# Patient Record
Sex: Male | Born: 2000 | Race: Black or African American | Hispanic: No | Marital: Single | Smoking: Current some day smoker
Health system: Southern US, Community
[De-identification: ages and names within clinical notes are randomized; demographics above are authoritative.]

## PROBLEM LIST (undated history)

## (undated) DIAGNOSIS — S6990XA Unspecified injury of unspecified wrist, hand and finger(s), initial encounter: Secondary | ICD-10-CM

---
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
## 2021-04-03 ENCOUNTER — Encounter (HOSPITAL_COMMUNITY): Payer: Self-pay

## 2021-04-03 ENCOUNTER — Other Ambulatory Visit: Payer: Self-pay

## 2021-04-03 ENCOUNTER — Ambulatory Visit (HOSPITAL_COMMUNITY)
Admission: RE | Admit: 2021-04-03 | Discharge: 2021-04-03 | Disposition: A | Discharge status: Home / Self Care | Payer: Medicaid Other | Source: Ambulatory Visit | Attending: Urgent Care | Admitting: Urgent Care

## 2021-04-03 VITALS — BP 146/68 | HR 76 | Temp 98.5°F | Resp 18

## 2021-04-03 DIAGNOSIS — R300 Dysuria: Secondary | ICD-10-CM | POA: Diagnosis present

## 2021-04-03 DIAGNOSIS — Z113 Encounter for screening for infections with a predominantly sexual mode of transmission: Secondary | ICD-10-CM | POA: Insufficient documentation

## 2021-04-03 LAB — HIV ANTIBODY (ROUTINE TESTING W REFLEX): HIV Screen 4th Generation wRfx: NONREACTIVE

## 2021-04-03 NOTE — ED Provider Notes (Signed)
  [facility]   MRN: [id number] DOB: 2001/11/21  Subjective:   Joseph Buchanan is a 20 y.o. male presenting for general screening for STDs.  Patient is sexually active but consistently uses condoms for protection.  It has been 1 year since he got checked.  Reports that he has had some mild burning after he urinates. Denies hematuria, urinary frequency, penile discharge, penile swelling, testicular pain, testicular swelling, anal pain, groin pain.  Does not hydrate very well with water.  Drinks sodas daily.  Denies regular use of alcohol, coffee, energy drinks.  No current facility-administered medications for this encounter. No current outpatient medications on file.   No Known Allergies  Past Medical History:  Diagnosis Date  . Hand injury    Broken right hand     History reviewed. No pertinent surgical history.  History reviewed. No pertinent family history.  Social History   Tobacco Use  . Smoking status: Passive Smoke Exposure - Never Smoker  . Smokeless tobacco: Never Used  Substance Use Topics  . Alcohol use: No  . Drug use: No    ROS   Objective:   Vitals: BP (!) 146/68 (BP Location: Left Arm)   Pulse 76   Temp 98.5 F (36.9 C) (Oral)   Resp 18   SpO2 99%   Physical Exam Constitutional:      General: He is not in acute distress.    Appearance: Normal appearance. He is well-developed and normal weight. He is not ill-appearing, toxic-appearing or diaphoretic.  HENT:     Head: Normocephalic and atraumatic.     Right Ear: External ear normal.     Left Ear: External ear normal.     Nose: Nose normal.     Mouth/Throat:     Pharynx: Oropharynx is clear.  Eyes:     General: No scleral icterus.       Right eye: No discharge.        Left eye: No discharge.     Extraocular Movements: Extraocular movements intact.     Pupils: Pupils are equal, round, and reactive to light.  Cardiovascular:     Rate and Rhythm: Normal rate.  Pulmonary:      Effort: Pulmonary effort is normal.  Musculoskeletal:     Cervical back: Normal range of motion.  Neurological:     Mental Status: He is alert and oriented to person, place, and time.  Psychiatric:        Mood and Affect: Mood normal.        Behavior: Behavior normal.        Thought Content: Thought content normal.        Judgment: Judgment normal.     Assessment and Plan :   PDMP not reviewed this encounter.  1. Screen for STD (sexually transmitted disease)   2. Dysuria     STI testing pending.  Recommended patient hydrate better with plain water and cut back on urinary irritants such as soda. Counseled patient on potential for adverse effects with medications prescribed/recommended today, ER and return-to-clinic precautions discussed, patient verbalized understanding.    Wallis Bamberg, PA-C 04/03/21 1324

## 2021-04-03 NOTE — ED Triage Notes (Signed)
PT reports no SX's .

## 2021-04-04 LAB — CYTOLOGY, (ORAL, ANAL, URETHRAL) ANCILLARY ONLY
Chlamydia: NEGATIVE
Comment: NEGATIVE
Comment: NEGATIVE
Comment: NORMAL
Neisseria Gonorrhea: NEGATIVE
Trichomonas: NEGATIVE

## 2021-04-04 LAB — RPR: RPR Ser Ql: NONREACTIVE

## 2021-07-13 ENCOUNTER — Other Ambulatory Visit: Payer: Self-pay

## 2021-07-13 ENCOUNTER — Ambulatory Visit (HOSPITAL_COMMUNITY)
Admission: EM | Admit: 2021-07-13 | Discharge: 2021-07-13 | Disposition: A | Discharge status: Home / Self Care | Payer: Medicaid Other | Attending: Internal Medicine | Admitting: Internal Medicine

## 2021-07-13 ENCOUNTER — Encounter (HOSPITAL_COMMUNITY): Payer: Self-pay | Admitting: *Deleted

## 2021-07-13 DIAGNOSIS — R21 Rash and other nonspecific skin eruption: Secondary | ICD-10-CM | POA: Diagnosis not present

## 2021-07-13 DIAGNOSIS — R238 Other skin changes: Secondary | ICD-10-CM | POA: Insufficient documentation

## 2021-07-13 DIAGNOSIS — N4889 Other specified disorders of penis: Secondary | ICD-10-CM | POA: Diagnosis present

## 2021-07-13 LAB — HIV ANTIBODY (ROUTINE TESTING W REFLEX): HIV Screen 4th Generation wRfx: NONREACTIVE

## 2021-07-13 MED ORDER — VALACYCLOVIR HCL 1 G PO TABS: 1000.0000 mg | ORAL_TABLET | Freq: Two times a day (BID) | ORAL | 0 refills | Status: AC

## 2021-07-13 MED ORDER — VALACYCLOVIR HCL 1 G PO TABS: 1000.0000 mg | ORAL_TABLET | Freq: Every day | ORAL | 0 refills | Status: DC

## 2021-07-13 NOTE — ED Provider Notes (Signed)
[facility]    CSN: [id number] Arrival date & time: 07/13/21  1312      History   Chief Complaint Chief Complaint  Patient presents with   Rash   SEXUALLY TRANSMITTED DISEASE    HPI Joseph Buchanan is a 20 y.o. male.   Patient presents to the urgent care for further evaluation of a rash that is present to penile shaft. Rash has been present for a few days. Denies history of similar rashes. Denies pain but states that it is itchy at times. Denies any penile discharge. Denies any urinary burning or irritation. Denies any known exposure to STD. Denies any recent unprotected sexual intercourse. Denies any scrotal pain.    Rash  Past Medical History:  Diagnosis Date   Hand injury    Broken right hand    There are no problems to display for this patient.   History reviewed. No pertinent surgical history.     Home Medications    Prior to Admission medications   Medication Sig Start Date End Date Taking? Authorizing Provider  valACYclovir (VALTREX) 1000 MG tablet Take 1 tablet (1,000 mg total) by mouth 2 (two) times daily for 7 days. 07/13/21 07/20/21  Lance Muss, FNP    Family History History reviewed. No pertinent family history.  Social History Social History   Tobacco Use   Smoking status: Passive Smoke Exposure - Never Smoker   Smokeless tobacco: Never  Substance Use Topics   Alcohol use: No   Drug use: No     Allergies   Patient has no known allergies.   Review of Systems Review of Systems  Skin:  Positive for rash.  Per HPI  Physical Exam Triage Vital Signs ED Triage Vitals [07/13/21 1440]  Enc Vitals Group     BP (!) 145/92     Pulse Rate 71     Resp 16     Temp 98.3 F (36.8 C)     Temp Source Oral     SpO2 100 %     Weight      Height      Head Circumference      Peak Flow      Pain Score 0     Pain Loc      Pain Edu?      Excl. in GC?    No data found.  Updated Vital Signs BP (!) 145/92   Pulse 71   Temp  98.3 F (36.8 C) (Oral)   Resp 16   SpO2 100%   Visual Acuity Right Eye Distance:   Left Eye Distance:   Bilateral Distance:    Right Eye Near:   Left Eye Near:    Bilateral Near:     Physical Exam Exam conducted with a chaperone present.  Constitutional:      General: He is not in acute distress.    Appearance: Normal appearance.  HENT:     Head: Normocephalic and atraumatic.  Eyes:     Extraocular Movements: Extraocular movements intact.     Conjunctiva/sclera: Conjunctivae normal.  Pulmonary:     Effort: Pulmonary effort is normal.  Genitourinary:    Pubic Area: Rash present.     Penis: No tenderness.      Testes: Normal.     Comments: Vesicular grouping of lesions present to left inferior portion of penile shaft. No surrounding erythema or swelling. No drainage to lesions noted.  Neurological:     General: No focal deficit present.  Mental Status: He is alert and oriented to person, place, and time. Mental status is at baseline.  Psychiatric:        Mood and Affect: Mood normal.        Behavior: Behavior normal.        Thought Content: Thought content normal.        Judgment: Judgment normal.     UC Treatments / Results  Labs (all labs ordered are listed, but only abnormal results are displayed) Labs Reviewed  HSV CULTURE AND TYPING  HIV ANTIBODY (ROUTINE TESTING W REFLEX)  RPR  CYTOLOGY, (ORAL, ANAL, URETHRAL) ANCILLARY ONLY    EKG   Radiology No results found.  Procedures Procedures (including critical care time)  Medications Ordered in UC Medications - No data to display  Initial Impression / Assessment and Plan / UC Course  I have reviewed the triage vital signs and the nursing notes.  Pertinent labs & imaging results that were available during my care of the patient were reviewed by me and considered in my medical decision making (see chart for details).     Rash is highly suspicious for genital herpes. Will treat with valacyclovir x7  days due to suspicion. HSV culture pending. STD penile swab pending for gonorrhea, chlamydia, and trich. HIV and RPR bloodwork pending. Patient was advised to refrain from any sexual activity until results are complete and until lesions are healed. Discussed strict return precautions. Patient verbalized understanding and is agreeable with plan.  Final Clinical Impressions(s) / UC Diagnoses   Final diagnoses:  Rash and nonspecific skin eruption  Vesicular rash  Penile irritation     Discharge Instructions      Please take prescribed medication as directed due to suspicion of genital herpes. STD testing is pending. We will call if these are positive. Please refrain from sexual activity.      ED Prescriptions     Medication Sig Dispense Auth. Provider   valACYclovir (VALTREX) 1000 MG tablet  (Status: Discontinued) Take 1 tablet (1,000 mg total) by mouth daily for 5 days. 5 tablet Lance Muss, FNP   valACYclovir (VALTREX) 1000 MG tablet Take 1 tablet (1,000 mg total) by mouth 2 (two) times daily for 7 days. 14 tablet Lance Muss, FNP      PDMP not reviewed this encounter.   Lance Muss, FNP 07/13/21 1550

## 2021-07-13 NOTE — Discharge Instructions (Addendum)
Please take prescribed medication as directed due to suspicion of genital herpes. STD testing is pending. We will call if these are positive. Please refrain from sexual activity.

## 2021-07-13 NOTE — ED Triage Notes (Signed)
Pt reports a rash on scrotum and std

## 2021-07-14 LAB — CYTOLOGY, (ORAL, ANAL, URETHRAL) ANCILLARY ONLY
Chlamydia: NEGATIVE
Comment: NEGATIVE
Comment: NEGATIVE
Comment: NORMAL
Neisseria Gonorrhea: NEGATIVE
Trichomonas: NEGATIVE

## 2021-07-14 LAB — RPR: RPR Ser Ql: NONREACTIVE

## 2021-07-16 LAB — HSV CULTURE AND TYPING

## 2021-07-17 NOTE — Progress Notes (Signed)
Notified patient of positive result. Counseling completed regarding safe sex practices. Patient stated that valacyclovir that was prescribed at previous visit was not available for pickup at pharmacy. [facility] called and verbal order was completed. Patient notified that prescription was available at [facility]. All questions were answered.

## 2021-07-24 ENCOUNTER — Telehealth (HOSPITAL_COMMUNITY): Payer: Self-pay | Admitting: Emergency Medicine

## 2021-07-24 MED ORDER — VALACYCLOVIR HCL 1 G PO TABS: 1000.0000 mg | ORAL_TABLET | Freq: Two times a day (BID) | ORAL | 0 refills | Status: AC

## 2021-07-24 NOTE — Telephone Encounter (Signed)
Patient returned call to review his results, all questions answered. States he never picked up Valtrex, was sent to wrong pharmacy.  Resent to pharmacy of request.

## 2021-09-03 ENCOUNTER — Ambulatory Visit (HOSPITAL_COMMUNITY)
Admission: EM | Admit: 2021-09-03 | Discharge: 2021-09-03 | Disposition: A | Discharge status: Home / Self Care | Payer: Medicaid Other | Attending: Family Medicine | Admitting: Family Medicine

## 2021-09-03 ENCOUNTER — Encounter (HOSPITAL_COMMUNITY): Payer: Self-pay | Admitting: Emergency Medicine

## 2021-09-03 ENCOUNTER — Other Ambulatory Visit: Payer: Self-pay

## 2021-09-03 DIAGNOSIS — L02411 Cutaneous abscess of right axilla: Secondary | ICD-10-CM

## 2021-09-03 DIAGNOSIS — Q386 Other congenital malformations of mouth: Secondary | ICD-10-CM

## 2021-09-03 DIAGNOSIS — L02419 Cutaneous abscess of limb, unspecified: Secondary | ICD-10-CM

## 2021-09-03 MED ORDER — CEPHALEXIN 500 MG PO CAPS: 500.0000 mg | ORAL_CAPSULE | Freq: Two times a day (BID) | ORAL | 0 refills | Status: DC

## 2021-09-03 MED ORDER — HIBICLENS 4 % EX LIQD: Freq: Every day | CUTANEOUS | 0 refills | Status: DC | PRN

## 2021-09-03 NOTE — ED Triage Notes (Signed)
Pt is present to day with a cyst under his right arm that he noticed in July. Pt states that the cyst rupture recently and he noticed drainage and bleeding coming from it.  Pt also noticed bumps around his mouth that he would like evaluated  Denies any pain or discomfort.

## 2021-09-06 NOTE — ED Provider Notes (Signed)
[facility]    CSN: [id number] Arrival date & time: 09/03/21  1910      History   Chief Complaint Chief Complaint  Patient presents with   Cyst    HPI Joseph Buchanan is a 20 y.o. male.   Presenting today with multiple concerns.  States that he has had a painful, swollen and draining area under his right arm that he first noticed several months ago, seems to flareup intermittently.  Started draining several days ago and swelling and turning red.  Has been putting warm compresses on it but otherwise not putting anything on it.  Denies fevers, chills, pain shooting down the arm.  Also states he is got some bumps that are not painful or itchy that have shown up on his lips and around mouth the past few days.  Not tried anything for symptoms.   Past Medical History:  Diagnosis Date   Hand injury    Broken right hand    There are no problems to display for this patient.   History reviewed. No pertinent surgical history.     Home Medications    Prior to Admission medications   Medication Sig Start Date End Date Taking? Authorizing Provider  cephALEXin (KEFLEX) 500 MG capsule Take 1 capsule (500 mg total) by mouth 2 (two) times daily. 09/03/21  Yes Particia Nearing, PA-C  chlorhexidine (HIBICLENS) 4 % external liquid Apply topically daily as needed. 09/03/21  Yes Particia Nearing, PA-C    Family History History reviewed. No pertinent family history.  Social History Social History   Tobacco Use   Smoking status: Passive Smoke Exposure - Never Smoker   Smokeless tobacco: Never  Substance Use Topics   Alcohol use: No   Drug use: No     Allergies   Patient has no known allergies.   Review of Systems Review of Systems Per HPI  Physical Exam Triage Vital Signs ED Triage Vitals  Enc Vitals Group     BP 09/03/21 1944 (!) 160/73     Pulse Rate 09/03/21 1944 91     Resp 09/03/21 1944 18     Temp 09/03/21 1944 98.6 F (37 C)     Temp src --       SpO2 09/03/21 1944 99 %     Weight --      Height --      Head Circumference --      Peak Flow --      Pain Score 09/03/21 1942 4     Pain Loc --      Pain Edu? --      Excl. in GC? --    No data found.  Updated Vital Signs BP (!) 160/73   Pulse 91   Temp 98.6 F (37 C)   Resp 18   SpO2 99%   Visual Acuity Right Eye Distance:   Left Eye Distance:   Bilateral Distance:    Right Eye Near:   Left Eye Near:    Bilateral Near:     Physical Exam Vitals and nursing note reviewed.  Constitutional:      Appearance: Normal appearance.  HENT:     Head: Atraumatic.  Eyes:     Extraocular Movements: Extraocular movements intact.     Conjunctiva/sclera: Conjunctivae normal.  Cardiovascular:     Rate and Rhythm: Normal rate and regular rhythm.  Pulmonary:     Effort: Pulmonary effort is normal.     Breath sounds: Normal breath  sounds.  Musculoskeletal:        General: No swelling. Normal range of motion.     Cervical back: Normal range of motion and neck supple.  Skin:    General: Skin is warm.     Comments: Small cluster of erythematous draining abscesses in the right axilla.  Tender to palpation.  Pinpoint white papular lesions on lips and around mouth sporadically.  Neurological:     General: No focal deficit present.     Mental Status: He is oriented to person, place, and time.  Psychiatric:        Mood and Affect: Mood normal.        Thought Content: Thought content normal.        Judgment: Judgment normal.     UC Treatments / Results  Labs (all labs ordered are listed, but only abnormal results are displayed) Labs Reviewed - No data to display  EKG   Radiology No results found.  Procedures Procedures (including critical care time)  Medications Ordered in UC Medications - No data to display  Initial Impression / Assessment and Plan / UC Course  I have reviewed the triage vital signs and the nursing notes.  Pertinent labs & imaging results  that were available during my care of the patient were reviewed by me and considered in my medical decision making (see chart for details).     No indication for I&D procedure today as the area in the right axilla is actively draining.  Warm compresses, Keflex, Hibiclens.  General surgery information given for follow-up.  Suspect Fordyce spots versus milia for the spots around his lips.  Discussed benign nature of these and just to continue to monitor.  Final Clinical Impressions(s) / UC Diagnoses   Final diagnoses:  Axillary abscess  Fordyce spots   Discharge Instructions   None    ED Prescriptions     Medication Sig Dispense Auth. Provider   cephALEXin (KEFLEX) 500 MG capsule Take 1 capsule (500 mg total) by mouth 2 (two) times daily. 14 capsule Particia Nearing, [place]   chlorhexidine (HIBICLENS) 4 % external liquid Apply topically daily as needed. 120 mL Particia Nearing, [place]      PDMP not reviewed this encounter.   Particia Nearing, [place] 09/06/21 1753

## 2021-10-22 ENCOUNTER — Ambulatory Visit (HOSPITAL_BASED_OUTPATIENT_CLINIC_OR_DEPARTMENT_OTHER): Payer: Medicaid Other | Admitting: Family Medicine

## 2021-11-05 ENCOUNTER — Other Ambulatory Visit: Payer: Self-pay

## 2021-11-05 ENCOUNTER — Ambulatory Visit
Admission: EM | Admit: 2021-11-05 | Discharge: 2021-11-05 | Disposition: A | Discharge status: Home / Self Care | Payer: Medicaid Other | Attending: Internal Medicine | Admitting: Internal Medicine

## 2021-11-05 ENCOUNTER — Encounter: Payer: Self-pay | Admitting: Emergency Medicine

## 2021-11-05 DIAGNOSIS — H65191 Other acute nonsuppurative otitis media, right ear: Secondary | ICD-10-CM | POA: Diagnosis not present

## 2021-11-05 MED ORDER — AMOXICILLIN-POT CLAVULANATE 875-125 MG PO TABS: 1.0000 | ORAL_TABLET | Freq: Two times a day (BID) | ORAL | 0 refills | Status: DC

## 2021-11-05 NOTE — ED Triage Notes (Signed)
Right ear pain x 2 days. Denies any injury to area, recent upper respiratory illness

## 2021-11-05 NOTE — Discharge Instructions (Signed)
You are being treated with Augmentin antibiotic to treat right ear infection.  Please take medication with food as this will help avoid nausea.  Follow-up if symptoms persist.

## 2021-11-05 NOTE — ED Provider Notes (Signed)
[name]    CSN: [id number] Arrival date & time: 11/05/21  1838      History   Chief Complaint Chief Complaint  Patient presents with   Otalgia    HPI Joseph Buchanan is a 20 y.o. male.   Patient presents with right ear pain that has been present for 2 days.  Denies any injury or trauma to the ear.  Denies any upper respiratory symptoms, fever, drainage from the ears.  Denies any decreased hearing.   Otalgia  Past Medical History:  Diagnosis Date   Hand injury    Broken right hand    There are no problems to display for this patient.   History reviewed. No pertinent surgical history.     Home Medications    Prior to Admission medications   Medication Sig Start Date End Date Taking? Authorizing Provider  amoxicillin-clavulanate (AUGMENTIN) 875-125 MG tablet Take 1 tablet by mouth every 12 (twelve) hours. 11/05/21  Yes Sebert Stollings, Rolly Salter E, FNP  cephALEXin (KEFLEX) 500 MG capsule Take 1 capsule (500 mg total) by mouth 2 (two) times daily. 09/03/21   Particia Nearing, PA-C  chlorhexidine (HIBICLENS) 4 % external liquid Apply topically daily as needed. 09/03/21   Particia Nearing, PA-C    Family History History reviewed. No pertinent family history.  Social History Social History   Tobacco Use   Smoking status: Passive Smoke Exposure - Never Smoker   Smokeless tobacco: Never  Substance Use Topics   Alcohol use: No   Drug use: No     Allergies   Patient has no known allergies.   Review of Systems Review of Systems Per HPI  Physical Exam Triage Vital Signs ED Triage Vitals  Enc Vitals Group     BP 11/05/21 2000 130/70     Pulse Rate 11/05/21 2000 75     Resp 11/05/21 2000 16     Temp 11/05/21 2000 97.8 F (36.6 C)     Temp Source 11/05/21 2000 Oral     SpO2 11/05/21 2000 96 %     Weight --      Height --      Head Circumference --      Peak Flow --      Pain Score 11/05/21 2001 5     Pain Loc --      Pain Edu? --       Excl. in GC? --    No data found.  Updated Vital Signs BP 130/70 (BP Location: Left Arm)   Pulse 75   Temp 97.8 F (36.6 C) (Oral)   Resp 16   SpO2 96%   Visual Acuity Right Eye Distance:   Left Eye Distance:   Bilateral Distance:    Right Eye Near:   Left Eye Near:    Bilateral Near:     Physical Exam Constitutional:      General: He is not in acute distress.    Appearance: Normal appearance. He is not toxic-appearing or diaphoretic.  HENT:     Head: Normocephalic and atraumatic.     Right Ear: Ear canal and external ear normal. No drainage, swelling or tenderness. No middle ear effusion. No mastoid tenderness. Tympanic membrane is erythematous. Tympanic membrane is not perforated or bulging.     Left Ear: Tympanic membrane, ear canal and external ear normal.  Eyes:     Extraocular Movements: Extraocular movements intact.     Conjunctiva/sclera: Conjunctivae normal.  Pulmonary:     Effort:  Pulmonary effort is normal.  Neurological:     General: No focal deficit present.     Mental Status: He is alert and oriented to person, place, and time. Mental status is at baseline.  Psychiatric:        Mood and Affect: Mood normal.        Behavior: Behavior normal.        Thought Content: Thought content normal.        Judgment: Judgment normal.     UC Treatments / Results  Labs (all labs ordered are listed, but only abnormal results are displayed) Labs Reviewed - No data to display  EKG   Radiology No results found.  Procedures Procedures (including critical care time)  Medications Ordered in UC Medications - No data to display  Initial Impression / Assessment and Plan / UC Course  I have reviewed the triage vital signs and the nursing notes.  Pertinent labs & imaging results that were available during my care of the patient were reviewed by me and considered in my medical decision making (see chart for details).     Will treat right ear infection with  Augmentin antibiotic due to patient recently being treated with cephalexin antibiotic.  No red flags seen on exam.  Discussed strict return precautions.  Patient verbalized understanding and was agreeable with plan. Final Clinical Impressions(s) / UC Diagnoses   Final diagnoses:  Other non-recurrent acute nonsuppurative otitis media of right ear     Discharge Instructions      You are being treated with Augmentin antibiotic to treat right ear infection.  Please take medication with food as this will help avoid nausea.  Follow-up if symptoms persist.     ED Prescriptions     Medication Sig Dispense Auth. Provider   amoxicillin-clavulanate (AUGMENTIN) 875-125 MG tablet Take 1 tablet by mouth every 12 (twelve) hours. 14 tablet [place], Acie Fredrickson, [place]      PDMP not reviewed this encounter.   Gustavus Bryant, [place] 11/05/21 2018

## 2021-11-25 ENCOUNTER — Encounter (HOSPITAL_BASED_OUTPATIENT_CLINIC_OR_DEPARTMENT_OTHER): Payer: Self-pay | Admitting: Family Medicine

## 2021-11-25 ENCOUNTER — Other Ambulatory Visit: Payer: Self-pay

## 2021-11-25 ENCOUNTER — Ambulatory Visit (INDEPENDENT_AMBULATORY_CARE_PROVIDER_SITE_OTHER): Payer: Medicaid Other | Admitting: Family Medicine

## 2021-11-25 DIAGNOSIS — L219 Seborrheic dermatitis, unspecified: Secondary | ICD-10-CM | POA: Insufficient documentation

## 2021-11-25 DIAGNOSIS — K130 Diseases of lips: Secondary | ICD-10-CM | POA: Insufficient documentation

## 2021-11-25 NOTE — Assessment & Plan Note (Signed)
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

## 2021-11-25 NOTE — Progress Notes (Signed)
New Patient Office Visit  Subjective:  Patient ID: Joseph Buchanan, male    DOB: 2001-02-01  Age: 20 y.o. MRN: [id number]  CC:  Chief Complaint  Patient presents with   Establish Care    Patient has no prior PCP in the last 3 years   Otalgia    Patient presents with complaints of itchy, red, irritation in both his ears for several weeks. He initially though it was due to the shampoo he was using but has since changed and is still having issues.    HPI Joseph Buchanan is a 20 year old male presenting to establish in clinic.  He has current concerns as outlined above.  Denies any significant past medical history.  Ears: Reports that he has had itching and irritation of bilateral auricles.  This has been going on for at least a few months.  Occasionally will have itching, dry blood, occasionally the area will be slightly wet.  Has not tried any specific treatment.  Was seen at urgent care for ear pain earlier this month and treated for ear infection.  Also has some questions about HSV.  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  Patient is currently working on the weekends.  Denies any specific hobbies or interests that he engages in outside of work. Patient is not aware of any specific family history  Past Medical History:  Diagnosis Date   Hand injury    Broken right hand    No past surgical history on file.  No family history on file.  Social History   Socioeconomic History   Marital status: Single    Spouse name: Not on file   Number of children: Not on file   Years of education: Not on file   Highest education level: Not on file  Occupational History   Not on file  Tobacco Use   Smoking status: Some Days    Types: Cigars    Passive exposure: Yes   Smokeless tobacco: Never   Tobacco comments:    Smokes black and mild cigars - 2-3 a week  Vaping Use   Vaping Use: Never used   Substance and Sexual Activity   Alcohol use: Yes    Comment: 1-2 a month   Drug use: Yes    Frequency: 7.0 times per week    Types: Marijuana    Comment: daily smoking   Sexual activity: Yes    Birth control/protection: Condom  Other Topics Concern   Not on file  Social History Narrative   Not on file   Social Determinants of Health   Financial Resource Strain: Not on file  Food Insecurity: Not on file  Transportation Needs: Not on file  Physical Activity: Not on file  Stress: Not on file  Social Connections: Not on file  Intimate Partner Violence: Not on file    Objective:   Today's Vitals: BP 122/76   Pulse 80   Ht 6\' 2"  (1.88 m)   Wt 257 lb 12.8 oz (116.9 kg)   SpO2 99%   BMI 33.10 kg/m   Physical Exam  HEENT: Bilateral auricles with areas of dried appearing skin, weight flaking, minimal overall.  No evidence of current bleeding or breaks in the skin.  External auditory canals are clear with mild cerumen noted, tympanic membranes are normal in appearance. Patient does have small lesion along upper vermilion border, appears to be small pustule  Assessment & Plan:   Problem List Items Addressed This Visit       Musculoskeletal and Integument   Seborrheic dermatitis    Suspect that otologic concerns are related to seborrheic dermatitis.  Discussed pathophysiology, etiology, treatment Recommend proceeding with use of topical therapies for treatment of current ear irritation and will plan to monitor response at follow-up visit If not improving as expected, consider referral to dermatology or ENT.        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   [DISCONTINUED] cephALEXin (KEFLEX) 500 MG capsule Take 1 capsule (500 mg total) by mouth 2 (two) times daily.   [DISCONTINUED] chlorhexidine (HIBICLENS) 4 % external liquid Apply topically daily as needed.   No facility-administered encounter medications on file as of 11/25/2021.    Follow-up: Return in about 3 months (around 02/24/2022) for Follow Up.  Plan for follow-up in about 3 months for CPE  Caliegh Middlekauff J De [place], MD

## 2021-11-25 NOTE — Patient Instructions (Signed)
  Medication Instructions:  Your physician recommends that you continue on your current medications as directed. Please refer to the Current Medication list given to you today. --If you need a refill on any your medications before your next appointment, please call your pharmacy first. If no refills are authorized on file call the office.-- Follow-Up: Your next appointment:   Your physician recommends that you schedule a follow-up appointment in: 3 MONTHS for CPE with Dr. de [place]  You will receive a text message or e-mail with a link to a survey about your care and experience with [place] today! We would greatly appreciate your feedback!   Thanks for letting us be apart of your health journey!!  Primary Care and Sports Medicine   Dr. Ceasar Mons [place]   We encourage you to activate your patient portal called "MyChart".  Sign up information is provided on this After Visit Summary.  MyChart is used to connect with patients for Virtual Visits (Telemedicine).  Patients are able to view lab/test results, encounter notes, upcoming appointments, etc.  Non-urgent messages can be sent to your provider as well. To learn more about what you can do with MyChart, please visit --  [email or web address].

## 2021-11-25 NOTE — Assessment & Plan Note (Signed)
Suspect that otologic concerns are related to seborrheic dermatitis.  Discussed pathophysiology, etiology, treatment Recommend proceeding with use of topical therapies for treatment of current ear irritation and will plan to monitor response at follow-up visit If not improving as expected, consider referral to dermatology or ENT.

## 2022-01-21 ENCOUNTER — Ambulatory Visit
Admission: EM | Admit: 2022-01-21 | Discharge: 2022-01-21 | Disposition: A | Discharge status: Home / Self Care | Payer: Medicaid Other | Attending: Physician Assistant | Admitting: Physician Assistant

## 2022-01-21 ENCOUNTER — Other Ambulatory Visit: Payer: Self-pay

## 2022-01-21 DIAGNOSIS — H65192 Other acute nonsuppurative otitis media, left ear: Secondary | ICD-10-CM | POA: Diagnosis not present

## 2022-01-21 DIAGNOSIS — L309 Dermatitis, unspecified: Secondary | ICD-10-CM

## 2022-01-21 MED ORDER — AMOXICILLIN 500 MG PO CAPS: 500.0000 mg | ORAL_CAPSULE | Freq: Three times a day (TID) | ORAL | 0 refills | Status: DC

## 2022-01-21 NOTE — ED Provider Notes (Signed)
[name]    CSN: [id number] Arrival date & time: 01/21/22  1853      History   Chief Complaint Chief Complaint  Patient presents with   Otalgia    HPI Joseph Buchanan is a 21 y.o. male.   Patient here today for evaluation of left ear pain. She has not had fever. She has not had improvement with OTC meds.   She also report rash to her hand. It is itchy at times. She has had similar in the past.   The history is provided by the patient.   Past Medical History:  Diagnosis Date   Hand injury    Broken right hand    Patient Active Problem List   Diagnosis Date Noted   Seborrheic dermatitis 11/25/2021   Lesion of lip 11/25/2021    History reviewed. No pertinent surgical history.     Home Medications    Prior to Admission medications   Medication Sig Start Date End Date Taking? Authorizing Provider  amoxicillin (AMOXIL) 500 MG capsule Take 1 capsule (500 mg total) by mouth 3 (three) times daily. 01/21/22  Yes Tomi Bamberger, PA-C    Family History History reviewed. No pertinent family history.  Social History Social History   Tobacco Use   Smoking status: Some Days    Types: Cigars    Passive exposure: Yes   Smokeless tobacco: Never   Tobacco comments:    Smokes black and mild cigars - 2-3 a week  Vaping Use   Vaping Use: Never used  Substance Use Topics   Alcohol use: Yes    Comment: 1-2 a month   Drug use: Yes    Frequency: 7.0 times per week    Types: Marijuana    Comment: daily smoking     Allergies   Patient has no known allergies.   Review of Systems Review of Systems  Constitutional:  Negative for chills and fever.  HENT:  Positive for congestion and ear pain. Negative for sore throat.   Eyes:  Negative for discharge and redness.  Respiratory:  Negative for cough and shortness of breath.   Gastrointestinal:  Negative for abdominal pain, nausea and vomiting.  Skin:  Positive for rash.    Physical Exam Triage  Vital Signs ED Triage Vitals  Enc Vitals Group     BP      Pulse      Resp      Temp      Temp src      SpO2      Weight      Height      Head Circumference      Peak Flow      Pain Score      Pain Loc      Pain Edu?      Excl. in GC?    No data found.  Updated Vital Signs BP 125/73 (BP Location: Left Arm)    Pulse 75    Temp 98.2 F (36.8 C) (Oral)    SpO2 96%      Physical Exam Vitals and nursing note reviewed.  Constitutional:      General: He is not in acute distress.    Appearance: Normal appearance. He is not ill-appearing.  HENT:     Head: Normocephalic and atraumatic.     Right Ear: Tympanic membrane normal.     Ears:     Comments: Left TM erythematous    Nose: Nose normal. No congestion.  Mouth/Throat:     Mouth: Mucous membranes are moist.     Pharynx: Oropharynx is clear. No oropharyngeal exudate or posterior oropharyngeal erythema.  Eyes:     Conjunctiva/sclera: Conjunctivae normal.  Cardiovascular:     Rate and Rhythm: Normal rate and regular rhythm.     Heart sounds: Normal heart sounds. No murmur heard. Pulmonary:     Effort: Pulmonary effort is normal. No respiratory distress.     Breath sounds: Normal breath sounds. No wheezing, rhonchi or rales.  Skin:    General: Skin is warm and dry.     Comments: Mildly erythematous papular rash to hands  Neurological:     Mental Status: He is alert.  Psychiatric:        Mood and Affect: Mood normal.        Thought Content: Thought content normal.     UC Treatments / Results  Labs (all labs ordered are listed, but only abnormal results are displayed) Labs Reviewed - No data to display  EKG   Radiology No results found.  Procedures Procedures (including critical care time)  Medications Ordered in UC Medications - No data to display  Initial Impression / Assessment and Plan / UC Course  I have reviewed the triage vital signs and the nursing notes.  Pertinent labs & imaging results  that were available during my care of the patient were reviewed by me and considered in my medical decision making (see chart for details).    Amoxicillin prescribed to cover otitis media. Recommended OTC steroids for hand rash and follow up if no improvement.  Final Clinical Impressions(s) / UC Diagnoses   Final diagnoses:  Other acute nonsuppurative otitis media of left ear, recurrence not specified  Dermatitis   Discharge Instructions   None    ED Prescriptions     Medication Sig Dispense Auth. Provider   amoxicillin (AMOXIL) 500 MG capsule Take 1 capsule (500 mg total) by mouth 3 (three) times daily. 21 capsule Tomi Bamberger, PA-C      PDMP not reviewed this encounter.   Tomi Bamberger, PA-C 01/30/22 [phone]

## 2022-01-21 NOTE — ED Triage Notes (Signed)
Pt c/o left ear pain onset ~ 2-3 days ago as well as dry skin on hands.   Denies sore throat, cough, nasal congestion, headache.

## 2022-01-30 ENCOUNTER — Encounter: Payer: Self-pay | Admitting: Physician Assistant

## 2022-02-24 ENCOUNTER — Ambulatory Visit (HOSPITAL_BASED_OUTPATIENT_CLINIC_OR_DEPARTMENT_OTHER): Payer: Medicaid Other | Admitting: Family Medicine

## 2022-02-26 ENCOUNTER — Other Ambulatory Visit: Payer: Self-pay

## 2022-02-26 ENCOUNTER — Ambulatory Visit (HOSPITAL_BASED_OUTPATIENT_CLINIC_OR_DEPARTMENT_OTHER): Payer: Medicaid Other | Admitting: Family Medicine

## 2022-02-26 ENCOUNTER — Encounter (HOSPITAL_BASED_OUTPATIENT_CLINIC_OR_DEPARTMENT_OTHER): Payer: Self-pay | Admitting: Family Medicine

## 2022-02-26 ENCOUNTER — Encounter (HOSPITAL_BASED_OUTPATIENT_CLINIC_OR_DEPARTMENT_OTHER): Payer: Self-pay

## 2022-02-26 VITALS — BP 138/82 | HR 67 | Ht 74.0 in | Wt 268.0 lb

## 2022-02-26 DIAGNOSIS — L219 Seborrheic dermatitis, unspecified: Secondary | ICD-10-CM | POA: Diagnosis not present

## 2022-02-26 MED ORDER — KETOCONAZOLE 2 % EX SHAM: MEDICATED_SHAMPOO | CUTANEOUS | 11 refills | Status: DC

## 2022-02-26 MED ORDER — KETOCONAZOLE 2 % EX CREA: 1.0000 "application " | TOPICAL_CREAM | Freq: Every day | CUTANEOUS | 1 refills | Status: DC

## 2022-02-26 NOTE — Patient Instructions (Signed)
°  Medication Instructions:  °Your physician recommends that you continue on your current medications as directed. Please refer to the Current Medication list given to you today. °--If you need a refill on any your medications before your next appointment, please call your pharmacy first. If no refills are authorized on file call the office.-- °Follow-Up: °Your next appointment:   °Your physician recommends that you schedule a follow-up appointment in: 3-4 MONTHS with Dr. de [place] ° °You will receive a text message or e-mail with a link to a survey about your care and experience with us today! We would greatly appreciate your feedback!  ° °Thanks for letting us be apart of your health journey!!  °Primary Care and Sports Medicine  ° °Dr. Raymond de [place]  ° °We encourage you to activate your patient portal called "MyChart".  Sign up information is provided on this After Visit Summary.  MyChart is used to connect with patients for Virtual Visits (Telemedicine).  Patients are able to view lab/test results, encounter notes, upcoming appointments, etc.  Non-urgent messages can be sent to your provider as well. To learn more about what you can do with MyChart, please visit --  [email or web address]   ° °

## 2022-02-26 NOTE — Assessment & Plan Note (Signed)
Patient continues to have some itching, flaking around the ears bilaterally.  Also with some dandruff and scalp itching as well.  Has been using OTC shampoos to try to help with symptoms. ?Exam consistent with seborrheic dermatitis ?Discussed options, will proceed with ketoconazole shampoo to use on scalp.  We will also send in ketoconazole cream to be used on ears and face as needed ?Plan for follow-up in 3 to 4 months to monitor progress ?If continuing to have symptoms, consider additional therapy with steroid cream or alternative antifungal ? ?

## 2022-02-26 NOTE — Progress Notes (Signed)
? ? ?  Procedures performed today:   ? ?None. ? ?Independent interpretation of notes and tests performed by another provider:  ? ?None. ? ?Brief History, Exam, Impression, and Recommendations:   ? ?BP 138/82   Pulse 67   Ht 6\' 2"  (1.88 m)   Wt 268 lb (121.6 kg)   SpO2 98%   BMI 34.41 kg/m?  ? ?Seborrheic dermatitis ?Patient continues to have some itching, flaking around the ears bilaterally.  Also with some dandruff and scalp itching as well.  Has been using OTC shampoos to try to help with symptoms. ?Exam consistent with seborrheic dermatitis ?Discussed options, will proceed with ketoconazole shampoo to use on scalp.  We will also send in ketoconazole cream to be used on ears and face as needed ?Plan for follow-up in 3 to 4 months to monitor progress ?If continuing to have symptoms, consider additional therapy with steroid cream or alternative antifungal ? ? ?___________________________________________ ?Adham Johnson de , MD, ABFM, CAQSM ?Primary Care and Sports Medicine ?[facility] ?

## 2022-02-28 ENCOUNTER — Encounter: Payer: Self-pay | Admitting: Emergency Medicine

## 2022-02-28 ENCOUNTER — Other Ambulatory Visit: Payer: Self-pay

## 2022-02-28 ENCOUNTER — Ambulatory Visit
Admission: EM | Admit: 2022-02-28 | Discharge: 2022-02-28 | Disposition: A | Discharge status: Home / Self Care | Payer: Medicaid Other | Attending: Physician Assistant | Admitting: Physician Assistant

## 2022-02-28 DIAGNOSIS — Z113 Encounter for screening for infections with a predominantly sexual mode of transmission: Secondary | ICD-10-CM

## 2022-02-28 NOTE — ED Provider Notes (Signed)
?[facility] ? ? ? ?CSN: [id number] ?Arrival date & time: 02/28/22  1218 ? ? ?  ? ?History   ?Chief Complaint ?Chief Complaint  ?Patient presents with  ? SEXUALLY TRANSMITTED DISEASE  ? ? ?HPI ?Joseph Buchanan is a 21 y.o. male.  ? ?Patient here today for STD screening. He denies any known exposures. He denies any current symptoms.  ? ?The history is provided by the patient.  ? ?Past Medical History:  ?Diagnosis Date  ? Hand injury   ? Broken right hand  ? ? ?Patient Active Problem List  ? Diagnosis Date Noted  ? Seborrheic dermatitis 11/25/2021  ? Lesion of lip 11/25/2021  ? ? ?History reviewed. No pertinent surgical history. ? ? ? ? ?Home Medications   ? ?Prior to Admission medications   ?Medication Sig Start Date End Date Taking? Authorizing Provider  ?ketoconazole (NIZORAL) 2 % cream Apply 1 application topically daily. To affected areas. 02/26/22   de [place], Raymond J, MD  ?ketoconazole (NIZORAL) 2 % shampoo Apply to scalp twice a week for 8 weeks and then weekly thereafter 02/26/22   de [place], Blondell Reveal, MD  ? ? ?Family History ?History reviewed. No pertinent family history. ? ?Social History ?Social History  ? ?Tobacco Use  ? Smoking status: Some Days  ?  Types: Cigars  ?  Passive exposure: Yes  ? Smokeless tobacco: Never  ? Tobacco comments:  ?  Smokes black and mild cigars - 2-3 a week  ?Vaping Use  ? Vaping Use: Never used  ?Substance Use Topics  ? Alcohol use: Yes  ?  Comment: 1-2 a month  ? Drug use: Yes  ?  Frequency: 7.0 times per week  ?  Types: Marijuana  ?  Comment: daily smoking  ? ? ? ?Allergies   ?Patient has no known allergies. ? ? ?Review of Systems ?Review of Systems  ?Constitutional:  Negative for chills and fever.  ?Eyes:  Negative for discharge and redness.  ?Gastrointestinal:  Negative for nausea and vomiting.  ?Genitourinary:  Negative for dysuria, genital sores and penile discharge.  ?Musculoskeletal:  Negative for back pain.  ?Skin:  Positive for color change and wound.   ?Neurological:  Negative for numbness.  ? ? ?Physical Exam ?Triage Vital Signs ?ED Triage Vitals  ?Enc Vitals Group  ?   BP   ?   Pulse   ?   Resp   ?   Temp   ?   Temp src   ?   SpO2   ?   Weight   ?   Height   ?   Head Circumference   ?   Peak Flow   ?   Pain Score   ?   Pain Loc   ?   Pain Edu?   ?   Excl. in [place]?   ? ?No data found. ? ?Updated Vital Signs ?BP 140/88 (BP Location: Right Arm)   Pulse 69   Temp 98.6 ?F (37 ?C) (Oral)   Resp 16   SpO2 99%  ?   ? ?Physical Exam ?Vitals and nursing note reviewed.  ?Constitutional:   ?   General: He is not in acute distress. ?   Appearance: Normal appearance. He is not ill-appearing.  ?HENT:  ?   Head: Normocephalic and atraumatic.  ?Eyes:  ?   Conjunctiva/sclera: Conjunctivae normal.  ?Cardiovascular:  ?   Rate and Rhythm: Normal rate.  ?Pulmonary:  ?   Effort: Pulmonary effort is  normal.  ?Neurological:  ?   Mental Status: He is alert.  ?Psychiatric:     ?   Mood and Affect: Mood normal.     ?   Behavior: Behavior normal.     ?   Thought Content: Thought content normal.  ? ? ? ?UC Treatments / Results  ?Labs ?(all labs ordered are listed, but only abnormal results are displayed) ?Labs Reviewed  ?HIV ANTIBODY (ROUTINE TESTING W REFLEX)  ?HEPATITIS PANEL, ACUTE  ?RPR  ?CYTOLOGY, (ORAL, ANAL, URETHRAL) ANCILLARY ONLY  ? ? ?EKG ? ? ?Radiology ?No results found. ? ?Procedures ?Procedures (including critical care time) ? ?Medications Ordered in UC ?Medications - No data to display ? ?Initial Impression / Assessment and Plan / UC Course  ?I have reviewed the triage vital signs and the nursing notes. ? ?Pertinent labs & imaging results that were available during my care of the patient were reviewed by me and considered in my medical decision making (see chart for details). ? ?  ?STD screening ordered as requested. Will await results for further recommendation. Encourage follow up with any further concerns.  ? ?Final Clinical Impressions(s) / UC Diagnoses  ? ?Final  diagnoses:  ?Screening for STD (sexually transmitted disease)  ? ?Discharge Instructions   ?None ?  ? ?ED Prescriptions   ?None ?  ? ?PDMP not reviewed this encounter. ?  ?Francene Finders, PA-C ?02/28/22 1239 ? ?

## 2022-02-28 NOTE — ED Triage Notes (Signed)
Just wants testing no symptoms ? ?

## 2022-03-01 LAB — RPR: RPR Ser Ql: NONREACTIVE

## 2022-03-01 LAB — HIV ANTIBODY (ROUTINE TESTING W REFLEX): HIV Screen 4th Generation wRfx: NONREACTIVE

## 2022-03-02 LAB — CYTOLOGY, (ORAL, ANAL, URETHRAL) ANCILLARY ONLY
Chlamydia: NEGATIVE
Comment: NEGATIVE
Comment: NEGATIVE
Comment: NORMAL
Neisseria Gonorrhea: NEGATIVE
Trichomonas: NEGATIVE

## 2022-05-04 ENCOUNTER — Ambulatory Visit
Admission: EM | Admit: 2022-05-04 | Discharge: 2022-05-04 | Disposition: A | Discharge status: Home / Self Care | Payer: Medicaid Other | Attending: Physician Assistant | Admitting: Physician Assistant

## 2022-05-04 DIAGNOSIS — L03012 Cellulitis of left finger: Secondary | ICD-10-CM

## 2022-05-04 MED ORDER — DOXYCYCLINE HYCLATE 100 MG PO CAPS: 100.0000 mg | ORAL_CAPSULE | Freq: Two times a day (BID) | ORAL | 0 refills | Status: DC

## 2022-05-04 NOTE — ED Triage Notes (Signed)
Patient presents to Urgent Care with complaints of swelling arounf L middle finger nail since last week . Patient reports pain and edema.  ? ?

## 2022-05-04 NOTE — Discharge Instructions (Addendum)
Return if any problems.

## 2022-05-06 DIAGNOSIS — L03012 Cellulitis of left finger: Secondary | ICD-10-CM

## 2022-05-06 NOTE — ED Provider Notes (Signed)
?[facility] ? ? ? ?CSN: [id number] ?Arrival date & time: 05/04/22  1919 ? ? ?  ? ?History   ?Chief Complaint ?Chief Complaint  ?Patient presents with  ? Hand Pain  ? ? ?HPI ?Joseph Buchanan is a 21 y.o. male.  ? ?Patient complains of swelling to the corner of his left middle finger.  Patient reports he had a cuticle that he pulled ? ?The history is provided by the patient. No language interpreter was used.  ?Hand Pain ?This is a new problem. The problem occurs constantly. The problem has not changed since onset.Nothing aggravates the symptoms.  ? ?Past Medical History:  ?Diagnosis Date  ? Hand injury   ? Broken right hand  ? ? ?Patient Active Problem List  ? Diagnosis Date Noted  ? Seborrheic dermatitis 11/25/2021  ? Lesion of lip 11/25/2021  ? ? ?History reviewed. No pertinent surgical history. ? ? ? ? ?Home Medications   ? ?Prior to Admission medications   ?Medication Sig Start Date End Date Taking? Authorizing Provider  ?doxycycline (VIBRAMYCIN) 100 MG capsule Take 1 capsule (100 mg total) by mouth 2 (two) times daily. 05/04/22  Yes Caryl Ada K, PA-C  ?ketoconazole (NIZORAL) 2 % cream Apply 1 application topically daily. To affected areas. 02/26/22   de [place], Raymond J, MD  ?ketoconazole (NIZORAL) 2 % shampoo Apply to scalp twice a week for 8 weeks and then weekly thereafter 02/26/22   de [place], Blondell Reveal, MD  ? ? ?Family History ?History reviewed. No pertinent family history. ? ?Social History ?Social History  ? ?Tobacco Use  ? Smoking status: Some Days  ?  Types: Cigars  ?  Passive exposure: Yes  ? Smokeless tobacco: Never  ? Tobacco comments:  ?  Smokes black and mild cigars - 2-3 a week  ?Vaping Use  ? Vaping Use: Never used  ?Substance Use Topics  ? Alcohol use: Yes  ?  Comment: 1-2 a month  ? Drug use: Yes  ?  Frequency: 7.0 times per week  ?  Types: Marijuana  ?  Comment: daily smoking  ? ? ? ?Allergies   ?Patient has no known allergies. ? ? ?Review of Systems ?Review of Systems  ?All other  systems reviewed and are negative. ? ? ?Physical Exam ?Triage Vital Signs ?ED Triage Vitals  ?Enc Vitals Group  ?   BP 05/04/22 1929 134/76  ?   Pulse Rate 05/04/22 1929 77  ?   Resp 05/04/22 1929 12  ?   Temp 05/04/22 1929 98.1 ?F (36.7 ?C)  ?   Temp Source 05/04/22 1929 Oral  ?   SpO2 05/04/22 1929 96 %  ?   Weight --   ?   Height --   ?   Head Circumference --   ?   Peak Flow --   ?   Pain Score 05/04/22 1951 5  ?   Pain Loc --   ?   Pain Edu? --   ?   Excl. in [place]? --   ? ?No data found. ? ?Updated Vital Signs ?BP 134/76 (BP Location: Right Arm)   Pulse 77   Temp 98.1 ?F (36.7 ?C) (Oral)   Resp 12   SpO2 96%  ? ?Visual Acuity ?Right Eye Distance:   ?Left Eye Distance:   ?Bilateral Distance:   ? ?Right Eye Near:   ?Left Eye Near:    ?Bilateral Near:    ? ?Physical Exam ?Vitals reviewed.  ?Constitutional:   ?  Appearance: Normal appearance.  ?Musculoskeletal:     ?   General: Swelling and tenderness present.  ?   Comments: Redness and slight discoloration to side of nail  ?Skin: ?   General: Skin is warm.  ?Neurological:  ?   General: No focal deficit present.  ?   Mental Status: He is alert.  ?Psychiatric:     ?   Mood and Affect: Mood normal.  ? ? ? ?UC Treatments / Results  ?Labs ?(all labs ordered are listed, but only abnormal results are displayed) ?Labs Reviewed - No data to display ? ?EKG ? ? ?Radiology ?No results found. ? ?Procedures ?Incision and Drainage ? ?Date/Time: 05/06/2022 11:08 AM ?Performed by: Fransico Meadow, PA-C ?Authorized by: Fransico Meadow, PA-C  ? ?Consent:  ?  Consent obtained:  Verbal ?  Consent given by:  Patient ?Universal protocol:  ?  Immediately prior to procedure, a time out was called: yes   ?Location:  ?  Type:  Abscess ?Pre-procedure details:  ?  Skin preparation:  Chlorhexidine ?Anesthesia:  ?  Anesthesia method:  Topical application ?Procedure type:  ?  Complexity:  Simple ?Procedure details:  ?  Incision types:  Single straight ?Post-procedure details:  ?  Procedure  completion:  Tolerated well, no immediate complications ?Comments:  ?   Bleeding only no purulent drainage (including critical care time) ? ?Medications Ordered in UC ?Medications - No data to display ? ?Initial Impression / Assessment and Plan / UC Course  ?I have reviewed the triage vital signs and the nursing notes. ? ?Pertinent labs & imaging results that were available during my care of the patient were reviewed by me and considered in my medical decision making (see chart for details). ? ?  ? ? ?Final Clinical Impressions(s) / UC Diagnoses  ? ?Final diagnoses:  ?Paronychia of left ring finger  ? ? ? ?Discharge Instructions   ? ?  ?Return if any problems.  ? ? ?ED Prescriptions   ? ? Medication Sig Dispense Auth. Provider  ? doxycycline (VIBRAMYCIN) 100 MG capsule Take 1 capsule (100 mg total) by mouth 2 (two) times daily. 20 capsule Fransico Meadow, [place]  ? ?  ? ?PDMP not reviewed this encounter. ?  ?Fransico Meadow, PA-C ?05/06/22 1109 ? ?

## 2022-06-29 ENCOUNTER — Ambulatory Visit (HOSPITAL_BASED_OUTPATIENT_CLINIC_OR_DEPARTMENT_OTHER): Payer: Medicaid Other | Admitting: Family Medicine

## 2022-07-17 ENCOUNTER — Encounter (HOSPITAL_BASED_OUTPATIENT_CLINIC_OR_DEPARTMENT_OTHER): Payer: Self-pay | Admitting: Family Medicine

## 2022-08-31 ENCOUNTER — Encounter (HOSPITAL_COMMUNITY): Payer: Self-pay

## 2022-08-31 ENCOUNTER — Emergency Department (HOSPITAL_COMMUNITY)
Admission: EM | Admit: 2022-08-31 | Discharge: 2022-08-31 | Disposition: A | Discharge status: Home / Self Care | Payer: Medicaid Other | Attending: Emergency Medicine | Admitting: Emergency Medicine

## 2022-08-31 DIAGNOSIS — L0231 Cutaneous abscess of buttock: Secondary | ICD-10-CM | POA: Insufficient documentation

## 2022-08-31 DIAGNOSIS — L0291 Cutaneous abscess, unspecified: Secondary | ICD-10-CM

## 2022-08-31 MED ORDER — DOXYCYCLINE HYCLATE 100 MG PO CAPS: 100.0000 mg | ORAL_CAPSULE | Freq: Two times a day (BID) | ORAL | 0 refills | Status: AC

## 2022-08-31 NOTE — ED Provider Notes (Signed)
[facility]-EMERGENCY DEPT Provider Note   CSN: [id number] Arrival date & time: 08/31/22  2309     History  Chief Complaint  Patient presents with   Abscess    Joseph Buchanan is a 21 y.o. male. Patient presents with possible cyst to his left buttocks.  He says that its been going on for about 1 week.  It was painful, but he says it has drained over the past few days.  He does want to get checked because he was concerned about having blood poisoning.  He has not had any associated fevers.  He has not had any other associated symptoms.    Abscess      Home Medications Prior to Admission medications   Medication Sig Start Date End Date Taking? Authorizing Provider  doxycycline (VIBRAMYCIN) 100 MG capsule Take 1 capsule (100 mg total) by mouth 2 (two) times daily for 7 days. 08/31/22 09/07/22 Yes Randie Bloodgood, Finis Bud, PA-C  ketoconazole (NIZORAL) 2 % cream Apply 1 application topically daily. To affected areas. 02/26/22   [place], Buren Kos, MD  ketoconazole (NIZORAL) 2 % shampoo Apply to scalp twice a week for 8 weeks and then weekly thereafter 02/26/22   [place], Buren Kos, MD      Allergies    Patient has no known allergies.    Review of Systems   Review of Systems  Skin:  Positive for color change.  All other systems reviewed and are negative.   Physical Exam Updated Vital Signs BP (!) 168/95 (BP Location: Left Arm)   Pulse 85   Temp 98.8 F (37.1 C) (Oral)   Resp 17   Ht 6\' 2"  (1.88 m)   Wt 124.7 kg   SpO2 97%   BMI 35.31 kg/m  Physical Exam Vitals and nursing note reviewed.  Constitutional:      General: He is not in acute distress.    Appearance: Normal appearance. He is well-developed. He is not ill-appearing, toxic-appearing or diaphoretic.  HENT:     Head: Normocephalic and atraumatic.     Nose: No nasal deformity.     Mouth/Throat:     Lips: Pink. No lesions.  Eyes:     General: Gaze aligned appropriately. No scleral icterus.        Right eye: No discharge.        Left eye: No discharge.     Conjunctiva/sclera: Conjunctivae normal.     Right eye: Right conjunctiva is not injected. No exudate or hemorrhage.    Left eye: Left conjunctiva is not injected. No exudate or hemorrhage. Pulmonary:     Effort: Pulmonary effort is normal. No respiratory distress.  Genitourinary:    Comments: Chaperone present for rectal exam.  There is a approximately 3 cm round slightly erythematous and indurated area that is not fluctuant.  Mildly tender to touch. Skin:    General: Skin is warm and dry.  Neurological:     Mental Status: He is alert and oriented to person, place, and time.  Psychiatric:        Mood and Affect: Mood normal.        Speech: Speech normal.        Behavior: Behavior normal. Behavior is cooperative.     ED Results / Procedures / Treatments   Labs (all labs ordered are listed, but only abnormal results are displayed) Labs Reviewed - No data to display  EKG None  Radiology No results found.  Procedures Procedures  Medications Ordered in ED Medications - No data to display  ED Course/ Medical Decision Making/ A&P                           Medical Decision Making Risk Prescription drug management.   Patient with concern for abscess to left buttock.  I did examine this area and there is no fluctuant area to be drained.  There is a small indurated area that could represent a possible cellulitis versus abscess that has already drained.  Patient has no systemic symptoms and I have low concern for sepsis or systemic infection at this time.  I recommend outpatient antibiotics.  Return precautions provided.  Follow-up with PCP.  Final Clinical Impression(s) / ED Diagnoses Final diagnoses:  Abscess    Rx / DC Orders ED Discharge Orders          Ordered    doxycycline (VIBRAMYCIN) 100 MG capsule  2 times daily        08/31/22 2342              Claudie Leach, PA-C 08/31/22 2349     Rolan Bucco, MD 09/04/22 1053

## 2022-08-31 NOTE — Discharge Instructions (Signed)
Please start taking antibiotics as prescribed. Please see your PCP for reevaluation. Return with worsening symptoms.

## 2022-08-31 NOTE — ED Triage Notes (Signed)
Patient arrived stating he has a cyst that began draining on Saturday. States he wanted to come to in be evaluated to make sure it was not causing an infection.

## 2022-08-31 NOTE — ED Notes (Signed)
Pt reports cyst on left buttock . Pt reports brown drainage from cyst.

## 2022-08-31 NOTE — ED Notes (Signed)
Incision and drainage tray at bedside.

## 2022-11-12 ENCOUNTER — Emergency Department (HOSPITAL_COMMUNITY): Payer: Medicaid Other

## 2022-11-12 ENCOUNTER — Other Ambulatory Visit: Payer: Self-pay

## 2022-11-12 ENCOUNTER — Emergency Department (HOSPITAL_COMMUNITY)
Admission: EM | Admit: 2022-11-12 | Discharge: 2022-11-13 | Disposition: A | Discharge status: Home / Self Care | Payer: Medicaid Other | Attending: Emergency Medicine | Admitting: Emergency Medicine

## 2022-11-12 DIAGNOSIS — S93601A Unspecified sprain of right foot, initial encounter: Secondary | ICD-10-CM | POA: Insufficient documentation

## 2022-11-12 DIAGNOSIS — Y9302 Activity, running: Secondary | ICD-10-CM | POA: Insufficient documentation

## 2022-11-12 DIAGNOSIS — W010XXA Fall on same level from slipping, tripping and stumbling without subsequent striking against object, initial encounter: Secondary | ICD-10-CM | POA: Insufficient documentation

## 2022-11-12 MED ORDER — KETOROLAC TROMETHAMINE 60 MG/2ML IM SOLN
30.0000 mg | Freq: Once | INTRAMUSCULAR | Status: AC
  Administered 2022-11-13: 30 mg via INTRAMUSCULAR
  Filled 2022-11-12: qty 2

## 2022-11-12 NOTE — ED Triage Notes (Signed)
Patient arrived stating around 5pm tonight he fell and hurt his right foot, some swelling noted.

## 2022-11-12 NOTE — ED Provider Notes (Signed)
[facility]-EMERGENCY DEPT Provider Note  CSN: [id number] Arrival date & time: 11/12/22 2257  Chief Complaint(s) Foot Pain  HPI Joseph Buchanan is a 21 y.o. male     Foot Pain This is a new problem. The current episode started 3 to 5 hours ago. The problem occurs constantly. The problem has not changed since onset.Pertinent negatives include no chest pain, no abdominal pain, no headaches and no shortness of breath. The symptoms are aggravated by walking. The symptoms are relieved by position. He has tried nothing for the symptoms.   Twisted foot while trying to take off running. Able to ambulate afterwards. No other injuries.  Past Medical History Past Medical History:  Diagnosis Date   Hand injury    Broken right hand   Patient Active Problem List   Diagnosis Date Noted   Seborrheic dermatitis 11/25/2021   Lesion of lip 11/25/2021   Home Medication(s) Prior to Admission medications   Medication Sig Start Date End Date Taking? Authorizing Provider  ketoconazole (NIZORAL) 2 % cream Apply 1 application topically daily. To affected areas. 02/26/22   [place], Buren Kos, MD  ketoconazole (NIZORAL) 2 % shampoo Apply to scalp twice a week for 8 weeks and then weekly thereafter 02/26/22   [place], Buren Kos, MD                                                                                                                                    Allergies Patient has no known allergies.  Review of Systems Review of Systems  Respiratory:  Negative for shortness of breath.   Cardiovascular:  Negative for chest pain.  Gastrointestinal:  Negative for abdominal pain.  Neurological:  Negative for headaches.   As noted in HPI  Physical Exam Vital Signs  I have reviewed the triage vital signs BP (!) 180/101 (BP Location: Right Arm)   Pulse 100   Temp 98.4 F (36.9 C) (Oral)   Resp 18   SpO2 99%   Physical Exam Vitals reviewed.  Constitutional:      General: He  is not in acute distress.    Appearance: He is well-developed. He is obese. He is not diaphoretic.  HENT:     Head: Normocephalic and atraumatic.     Right Ear: External ear normal.     Left Ear: External ear normal.     Nose: Nose normal.     Mouth/Throat:     Mouth: Mucous membranes are moist.  Eyes:     General: No scleral icterus.    Conjunctiva/sclera: Conjunctivae normal.  Neck:     Trachea: Phonation normal.  Cardiovascular:     Rate and Rhythm: Normal rate and regular rhythm.  Pulmonary:     Effort: Pulmonary effort is normal. No respiratory distress.     Breath sounds: No stridor.  Abdominal:     General: There is no distension.  Musculoskeletal:  General: Normal range of motion.     Cervical back: Normal range of motion.     Right foot: Normal range of motion. Swelling, tenderness and bony tenderness present. No crepitus. Normal pulse.     Left foot: Normal pulse.       Feet:  Neurological:     Mental Status: He is alert and oriented to person, place, and time.  Psychiatric:        Behavior: Behavior normal.     ED Results and Treatments Labs (all labs ordered are listed, but only abnormal results are displayed) Labs Reviewed - No data to display                                                                                                                       EKG  EKG Interpretation  Date/Time:    Ventricular Rate:    PR Interval:    QRS Duration:   QT Interval:    QTC Calculation:   R Axis:     Text Interpretation:         Radiology DG Foot Complete Right  Result Date: 11/12/2022 CLINICAL DATA:  Foot injury EXAM: RIGHT FOOT COMPLETE - 3+ VIEW COMPARISON:  None Available. FINDINGS: There is no evidence of fracture or dislocation. There is no evidence of arthropathy or other focal bone abnormality. Soft tissues are unremarkable. IMPRESSION: Negative. Electronically Signed   By: Jasmine Pang M.D.   On: 11/12/2022 23:28    Medications  Ordered in ED Medications  ketorolac (TORADOL) injection 30 mg (has no administration in time range)                                                                                                                                     Procedures Procedures  (including critical care time)  Medical Decision Making / ED Course   Medical Decision Making Amount and/or Complexity of Data Reviewed Radiology: ordered and independent interpretation performed. Decision-making details documented in ED Course.  Risk Prescription drug management.    Mid foot pain and swelling Xray negative for obvious fracture. Likely midfoot sprain, but cannot rule out hairline fracture. Recommended hardsole shoes, RICE. IM toradol given.      Final Clinical Impression(s) / ED Diagnoses Final diagnoses:  Sprain of right foot, initial encounter   The patient appears reasonably screened and/or stabilized for discharge and I doubt any other medical condition  or other [facility] requiring further screening, evaluation, or treatment in the ED at this time. I have discussed the findings, Dx and Tx plan with the patient/family who expressed understanding and agree(s) with the plan. Discharge instructions discussed at length. The patient/family was given strict return precautions who verbalized understanding of the instructions. No further questions at time of discharge.  Disposition: Discharge  Condition: Good  ED Discharge Orders     None        Follow Up: de [place], Raymond J, MD [address] [place] [address] [phone]  Call  to schedule an appointment for close follow up  Lenda Kelp, MD [address] [place] [address] [phone]  Call  to schedule an appointment for close follow up           This chart was dictated using voice recognition software.  Despite best efforts to proofread,  errors can occur which can change the documentation meaning.    Nira Conn, MD 11/12/22 [phone]

## 2023-02-19 ENCOUNTER — Ambulatory Visit
Admission: EM | Admit: 2023-02-19 | Discharge: 2023-02-19 | Disposition: A | Discharge status: Home / Self Care | Payer: Medicaid Other | Attending: Internal Medicine | Admitting: Internal Medicine

## 2023-02-19 DIAGNOSIS — K130 Diseases of lips: Secondary | ICD-10-CM | POA: Diagnosis not present

## 2023-02-19 MED ORDER — MUPIROCIN 2 % EX OINT: 1.0000 | TOPICAL_OINTMENT | Freq: Two times a day (BID) | CUTANEOUS | 0 refills | Status: DC

## 2023-02-19 NOTE — Discharge Instructions (Signed)
I have prescribed an antibiotic ointment to apply directly to affected area.  Do not apply inside the mouth.  Follow-up if any symptoms persist or worsen.

## 2023-02-19 NOTE — ED Triage Notes (Signed)
Pt c/o a red spot to rt side of upper lip since yesterday. Denies pain to area.

## 2023-02-19 NOTE — ED Provider Notes (Signed)
[name]    CSN: [id number] Arrival date & time: 02/19/23  1117      History   Chief Complaint Chief Complaint  Patient presents with   Rash    HPI Joseph Buchanan is a 22 y.o. male.   Patient presents with lesion to right upper lip that he noticed yesterday.  Denies any drainage to the area.  Reports history of the same that usually self resolves.  Denies injury to the area.  Has used warm compresses with some improvement.   Rash   Past Medical History:  Diagnosis Date   Hand injury    Broken right hand    Patient Active Problem List   Diagnosis Date Noted   Seborrheic dermatitis 11/25/2021   Lesion of lip 11/25/2021    History reviewed. No pertinent surgical history.     Home Medications    Prior to Admission medications   Medication Sig Start Date End Date Taking? Authorizing Provider  mupirocin ointment (BACTROBAN) 2 % Apply 1 Application topically 2 (two) times daily. Do not apply inside the mouth. 02/19/23  Yes Byren Pankow, [place] E, FNP    Family History Family History  Problem Relation Age of Onset   Healthy Mother    Healthy Father     Social History Social History   Tobacco Use   Smoking status: Some Days    Types: Cigars    Passive exposure: Yes   Smokeless tobacco: Never   Tobacco comments:    Smokes black and mild cigars - 2-3 a week  Vaping Use   Vaping Use: Never used  Substance Use Topics   Alcohol use: Yes    Comment: 1-2 a month   Drug use: Yes    Frequency: 7.0 times per week    Types: Marijuana    Comment: daily smoking     Allergies   Patient has no known allergies.   Review of Systems Review of Systems Per HPI  Physical Exam Triage Vital Signs ED Triage Vitals  Enc Vitals Group     BP 02/19/23 1244 (!) 153/90     Pulse Rate 02/19/23 1244 67     Resp 02/19/23 1244 18     Temp 02/19/23 1244 97.9 F (36.6 C)     Temp src --      SpO2 02/19/23 1244 97 %     Weight --      Height --      Head  Circumference --      Peak Flow --      Pain Score 02/19/23 1245 0     Pain Loc --      Pain Edu? --      Excl. in [place]? --    No data found.  Updated Vital Signs BP (!) 153/90 (BP Location: Left Arm)   Pulse 67   Temp 97.9 F (36.6 C)   Resp 18   SpO2 97%   Visual Acuity Right Eye Distance:   Left Eye Distance:   Bilateral Distance:    Right Eye Near:   Left Eye Near:    Bilateral Near:     Physical Exam Constitutional:      General: He is not in acute distress.    Appearance: Normal appearance. He is not toxic-appearing or diaphoretic.  HENT:     Head: Normocephalic and atraumatic.     Mouth/Throat:      Comments: Very small, pinpoint, flat erythematous circular lesion present to right upper lip.  No drainage noted.  No area of fluctuance. Eyes:     Extraocular Movements: Extraocular movements intact.     Conjunctiva/sclera: Conjunctivae normal.  Pulmonary:     Effort: Pulmonary effort is normal.  Neurological:     General: No focal deficit present.     Mental Status: He is alert and oriented to person, place, and time. Mental status is at baseline.  Psychiatric:        Mood and Affect: Mood normal.        Behavior: Behavior normal.        Thought Content: Thought content normal.        Judgment: Judgment normal.      UC Treatments / Results  Labs (all labs ordered are listed, but only abnormal results are displayed) Labs Reviewed - No data to display  EKG   Radiology No results found.  Procedures Procedures (including critical care time)  Medications Ordered in UC Medications - No data to display  Initial Impression / Assessment and Plan / UC Course  I have reviewed the triage vital signs and the nursing notes.  Pertinent labs & imaging results that were available during my care of the patient were reviewed by me and considered in my medical decision making (see chart for details).     Unsure exact etiology of patient's lip lesion.  Does  not appear to be cold sore.  No signs of bacterial infection.  No need for drainage.  Advised patient to apply mupirocin topically.  Advised to follow-up if symptoms persist or worsen.  Patient verbalized understanding and was agreeable with plan. Final Clinical Impressions(s) / UC Diagnoses   Final diagnoses:  Lip lesion     Discharge Instructions      I have prescribed an antibiotic ointment to apply directly to affected area.  Do not apply inside the mouth.  Follow-up if any symptoms persist or worsen.    ED Prescriptions     Medication Sig Dispense Auth. Provider   mupirocin ointment (BACTROBAN) 2 % Apply 1 Application topically 2 (two) times daily. Do not apply inside the mouth. 22 g Teodora Medici, [place]      PDMP not reviewed this encounter.   Teodora Medici, [place] 02/19/23 1315

## 2023-11-17 ENCOUNTER — Encounter (HOSPITAL_COMMUNITY): Payer: Self-pay

## 2023-11-17 ENCOUNTER — Emergency Department (HOSPITAL_COMMUNITY)
Admission: EM | Admit: 2023-11-17 | Discharge: 2023-11-17 | Disposition: A | Discharge status: Home / Self Care | Payer: Medicaid Other | Attending: Emergency Medicine | Admitting: Emergency Medicine

## 2023-11-17 ENCOUNTER — Other Ambulatory Visit: Payer: Self-pay

## 2023-11-17 DIAGNOSIS — H9202 Otalgia, left ear: Secondary | ICD-10-CM | POA: Diagnosis present

## 2023-11-17 MED ORDER — OFLOXACIN 0.3 % OT SOLN: 5.0000 [drp] | Freq: Two times a day (BID) | OTIC | 0 refills | Status: AC

## 2023-11-17 NOTE — ED Provider Notes (Signed)
[place] EMERGENCY DEPARTMENT AT [facility] Provider Note   CSN: [id number] Arrival date & time: 11/17/23  0011     History Chief Complaint  Patient presents with   Otalgia    Joseph Buchanan is a 22 y.o. male reportedly otherwise healthy presents the management today for evaluation of left ear pain for almost a week.  He reports that he started to have some pain when he was using Q-tips and alcohol to keep the area clean.  He reports that he felt some blood in his ear last time he did this and thought that he noticed a small amount of pus/yellow crust.  He denies any active discharge from the ear.  Denies any fever, neck pain, and neck stiffness, or headache.  He denies any hearing changes.  Denies any rhinorrhea, nasal congestion, sore throat.  No pain with swallowing.  Denies any trauma to the ear.   Otalgia Associated symptoms: no congestion, no fever, no headaches, no hearing loss, no neck pain, no rhinorrhea and no sore throat        Home Medications Prior to Admission medications   Medication Sig Start Date End Date Taking? Authorizing Provider  mupirocin ointment (BACTROBAN) 2 % Apply 1 Application topically 2 (two) times daily. Do not apply inside the mouth. 02/19/23   Gustavus Bryant, FNP      Allergies    Patient has no known allergies.    Review of Systems   Review of Systems  Constitutional:  Negative for chills and fever.  HENT:  Positive for ear pain. Negative for congestion, hearing loss, rhinorrhea and sore throat.   Musculoskeletal:  Negative for neck pain and neck stiffness.  Neurological:  Negative for headaches.    Physical Exam Updated Vital Signs BP (!) 171/94 (BP Location: Right Arm)   Pulse 74   Temp 98.5 F (36.9 C) (Oral)   Resp 15   Ht 6\' 2"  (1.88 m)   Wt 124.7 kg   SpO2 98%   BMI 35.30 kg/m  Physical Exam Vitals and nursing note reviewed.  Constitutional:      General: He is not in acute distress.    Appearance: He is  not toxic-appearing.  HENT:     Left Ear: Tympanic membrane normal.     Ears:      Comments: At the marked area was a small pustule.  There was some dried blood and small amount of crusting beside it.  TM is clear.  There is no external ear canal swelling.  There is no pain or tenderness manipulation of the pinna.  There is no mastoid overlying tenderness, swelling, or skin change noted. Eyes:     General: No scleral icterus. Pulmonary:     Effort: Pulmonary effort is normal. No respiratory distress.  Musculoskeletal:     Cervical back: Normal range of motion. No rigidity.  Skin:    General: Skin is warm and dry.  Neurological:     General: No focal deficit present.     Mental Status: He is alert. Mental status is at baseline.     ED Results / Procedures / Treatments   Labs (all labs ordered are listed, but only abnormal results are displayed) Labs Reviewed - No data to display  EKG None  Radiology No results found.  Procedures .Marland KitchenIncision and Drainage  Date/Time: 11/17/2023 1:50 AM  Performed by: Achille Rich, PA-C Authorized by: Achille Rich, PA-C   Consent:    Consent obtained:  Verbal  Consent given by:  Patient   Risks, benefits, and alternatives were discussed: yes     Risks discussed:  Bleeding, infection and pain Location:    Type:  Cyst   Size:  <0.5   Location:  Head   Head location:  L external auditory canal Anesthesia:    Anesthesia method:  None Procedure type:    Complexity:  Simple Procedure details:    Drainage:  Purulent and bloody Post-procedure details:    Procedure completion:  Tolerated well, no immediate complications Comments:     I used 2 Q-tips to gently press on the sides of the pustule.  Was able to express some thick, white cystic product.  Some blood present.  Patient reports immediate relief upon release of this.  Bleeding controlled.  Patient tolerated procedure.    Medications Ordered in ED Medications - No data to  display  ED Course/ Medical Decision Making/ A&P                              Medical Decision Making Risk Prescription drug management.   22 y.o. male presents to the ER today for evaluation of ear pain. Differential diagnosis includes but is not limited to otitis media, otitis externa, mastoiditis, malignant otitis media, cellulitis, simple. Vital signs show elevated blood pressure otherwise unremarkable. Physical exam as noted above.   Please see procedure note.  The patient had a small superficial papule pustule.  Was able to remove small amount of cystic product from it.  Patient reports immediate relief with this.  He has no pain with manipulation of the pinna.  There is no overlying mastoid tenderness or swelling.  He does not have any edema or significant swelling or erythema to the external ear canal.  TM is intact and pearly.  Will prescribe him a an antibiotic eardrop to use.  Advised him not to use Q-tips anymore.  I do not think he has any mastoiditis, otitis externa, or otitis media.  He is stable for discharge home with strict return precautions.  We discussed plan at bedside. We discussed strict return precautions and red flag symptoms. The patient verbalized their understanding and agrees to the plan. The patient is stable and being discharged home in good condition.  Portions of this report may have been transcribed using voice recognition software. Every effort was made to ensure accuracy; however, inadvertent computerized transcription errors may be present.   Final Clinical Impression(s) / ED Diagnoses Final diagnoses:  Otalgia of left ear    Rx / DC Orders ED Discharge Orders          Ordered    ofloxacin (FLOXIN) 0.3 % OTIC solution  2 times daily        11/17/23 0148              Achille Rich, PA-C 11/17/23 0155    Gilda Crease, MD 11/17/23 [phone]

## 2023-11-17 NOTE — Discharge Instructions (Addendum)
You were seen in the ER today for evaluation of your ear pain.  It was discovered that you have an ear pimple."  Reported healthy with this today.  I am prescribing you some antibiotic to place on top of the area twice daily for the next 7 days.  Please avoid using Q-tips in the ear as this can further damage the structures.  If you have any significant ear swelling, ear pain, swelling or tenderness behind or to the front of your ear, loss of hearing, stiff neck, fever, headache, please return here for evaluation.  If you have any concerns, or any new/worsening symptoms, please return to the ER for evaluation.  Contact a health care provider if: Your pain does not improve within 2 days. Your earache gets worse. You have new symptoms. You have a fever. Get help right away if: You have a severe headache. You have a stiff neck. You have trouble swallowing. You have redness or swelling behind your ear. You have fluid or blood coming from your ear. You have hearing loss. You feel dizzy.

## 2023-11-17 NOTE — ED Triage Notes (Signed)
Pt from home d/t left ear pain with drainage of blood and pus - has had for 1 week - has had no meds at home for pain.

## 2024-03-07 ENCOUNTER — Ambulatory Visit
Admission: EM | Admit: 2024-03-07 | Discharge: 2024-03-07 | Disposition: A | Discharge status: Home / Self Care | Attending: Family Medicine | Admitting: Family Medicine

## 2024-03-07 DIAGNOSIS — B351 Tinea unguium: Secondary | ICD-10-CM

## 2024-03-07 NOTE — ED Provider Notes (Signed)
 [name]    CSN: [id number] Arrival date & time: 03/07/24  0946      History   Chief Complaint Chief Complaint  Patient presents with   Nail Problem    HPI Joseph Buchanan is a 23 y.o. male.   HPI Patient presents today for evaluation of chronic toenail infection involving bilateral great toes. Patient reports he has had this problem for several months.  He said within the last few months left toenail on the great toe has turned black.  He also has thickened discolored long toenails.  He has never been treated for fungal infection. Past Medical History:  Diagnosis Date   Hand injury    Broken right hand    Patient Active Problem List   Diagnosis Date Noted   Seborrheic dermatitis 11/25/2021   Lesion of lip 11/25/2021    History reviewed. No pertinent surgical history.     Home Medications    Prior to Admission medications   Medication Sig Start Date End Date Taking? Authorizing Provider  mupirocin ointment (BACTROBAN) 2 % Apply 1 Application topically 2 (two) times daily. Do not apply inside the mouth. 02/19/23   Gustavus Bryant, FNP    Family History Family History  Problem Relation Age of Onset   Healthy Mother    Healthy Father     Social History Social History   Tobacco Use   Smoking status: Some Days    Types: Cigars    Passive exposure: Yes   Smokeless tobacco: Never   Tobacco comments:    Smokes black and mild cigars - 2-3 a week  Vaping Use   Vaping status: Never Used  Substance Use Topics   Alcohol use: Yes    Comment: 1-2 a month   Drug use: Yes    Frequency: 7.0 times per week    Types: Marijuana    Comment: daily smoking     Allergies   Patient has no known allergies.   Review of Systems Review of Systems Pertinent negatives listed in HPI   Physical Exam Triage Vital Signs ED Triage Vitals  Encounter Vitals Group     BP 03/07/24 1248 132/82     Systolic BP Percentile --      Diastolic BP Percentile --       Pulse Rate 03/07/24 1248 60     Resp 03/07/24 1248 18     Temp 03/07/24 1248 (!) 97.5 F (36.4 C)     Temp Source 03/07/24 1248 Oral     SpO2 03/07/24 1248 98 %     Weight 03/07/24 1247 250 lb (113.4 kg)     Height 03/07/24 1247 6\' 1"  (1.854 m)     Head Circumference --      Peak Flow --      Pain Score 03/07/24 1245 0     Pain Loc --      Pain Education --      Exclude from Growth Chart --    No data found.  Updated Vital Signs BP 132/82 (BP Location: Left Arm)   Pulse 60   Temp (!) 97.5 F (36.4 C) (Oral)   Resp 18   Ht 6\' 1"  (1.854 m)   Wt 250 lb (113.4 kg)   SpO2 98%   BMI 32.98 kg/m   Visual Acuity Right Eye Distance:   Left Eye Distance:   Bilateral Distance:    Right Eye Near:   Left Eye Near:    Bilateral Near:  Physical Exam Constitutional:      Appearance: Normal appearance.  HENT:     Head: Normocephalic and atraumatic.  Eyes:     Extraocular Movements: Extraocular movements intact.     Pupils: Pupils are equal, round, and reactive to light.  Cardiovascular:     Rate and Rhythm: Normal rate and regular rhythm.  Pulmonary:     Effort: Pulmonary effort is normal.     Breath sounds: Normal breath sounds.  Feet:     Right foot:     Toenail Condition: Right toenails are abnormally thick and long. Fungal disease present.    Left foot:     Toenail Condition: Left toenails are abnormally thick and long. Fungal disease present. Skin:    General: Skin is warm.  Neurological:     General: No focal deficit present.     Mental Status: He is alert and oriented to person, place, and time.      UC Treatments / Results  Labs (all labs ordered are listed, but only abnormal results are displayed) Labs Reviewed - No data to display  EKG   Radiology No results found.  Procedures Procedures (including critical care time)  Medications Ordered in UC Medications - No data to display  Initial Impression / Assessment and Plan / UC Course  I  have reviewed the triage vital signs and the nursing notes.  Pertinent labs & imaging results that were available during my care of the patient were reviewed by me and considered in my medical decision making (see chart for details).    Patient has extensive toenail fungus that will require an extended course of treatment for dilution.  Patient has been referred to podiatry at [facility] for evaluation and  management. Final Clinical Impressions(s) / UC Diagnoses   Final diagnoses:  Fungal toenail infection     Discharge Instructions      [facility] will follow-up with you to schedule an appointment or you may contact them tomorrow to schedule an appointment to start treatment for your fungal toe infection.     ED Prescriptions   None    PDMP not reviewed this encounter.   Bing Neighbors, NP 03/07/24 1553

## 2024-03-07 NOTE — Discharge Instructions (Signed)
 [facility] will follow-up with you to schedule an appointment or you may contact them tomorrow to schedule an appointment to start treatment for your fungal toe infection.

## 2024-03-07 NOTE — ED Triage Notes (Signed)
"  I have toe infections on both great toes". First noticed "a while ago". Swelling, Redness, Drainage. No fever. No PCP.

## 2024-03-15 ENCOUNTER — Encounter: Payer: Self-pay | Admitting: Podiatry

## 2024-03-15 ENCOUNTER — Ambulatory Visit (INDEPENDENT_AMBULATORY_CARE_PROVIDER_SITE_OTHER): Admitting: Podiatry

## 2024-03-15 VITALS — Ht 73.0 in | Wt 250.0 lb

## 2024-03-15 DIAGNOSIS — M79674 Pain in right toe(s): Secondary | ICD-10-CM

## 2024-03-15 DIAGNOSIS — M79675 Pain in left toe(s): Secondary | ICD-10-CM | POA: Diagnosis not present

## 2024-03-15 DIAGNOSIS — B351 Tinea unguium: Secondary | ICD-10-CM

## 2024-03-15 NOTE — Progress Notes (Signed)
   Chief Complaint  Patient presents with   Nail Problem    Pt is here due to bilateral great toenail fungal infection states its been like that for over 6 months.    SUBJECTIVE Patient presents to office today complaining of elongated, thickened nails that cause pain while ambulating in shoes.  Patient is unable to trim their own nails. Patient is here for further evaluation and treatment.  Past Medical History:  Diagnosis Date   Hand injury    Broken right hand    No Known Allergies   OBJECTIVE General Patient is awake, alert, and oriented x 3 and in no acute distress. Derm Skin is dry and supple bilateral. Negative open lesions or macerations. Remaining integument unremarkable. Nails are tender, long, thickened and dystrophic with subungual debris, consistent with onychomycosis, 1-5 bilateral. No signs of infection noted. Vasc  DP and PT pedal pulses palpable bilaterally. Temperature gradient within normal limits.  Neuro Epicritic and protective threshold sensation grossly intact bilaterally.  Musculoskeletal Exam No symptomatic pedal deformities noted bilateral. Muscular strength within normal limits.  ASSESSMENT 1.  Pain due to onychomycosis of toenails both  PLAN OF CARE 1. Patient evaluated today.  2. Instructed to maintain good pedal hygiene and foot care.  3. Mechanical debridement of nails 1-5 bilaterally performed using a nail nipper. Filed with dremel without incident.  4. Return to clinic in 3 mos.    Felecia Shelling, DPM [facility]  Dr. Felecia Shelling, DPM    [address] [place], [place] [address]                Office [phone]  Fax [phone]

## 2024-04-10 ENCOUNTER — Ambulatory Visit: Payer: Self-pay

## 2024-06-26 ENCOUNTER — Inpatient Hospital Stay
Admission: RE | Admit: 2024-06-26 | Discharge: 2024-06-26 | Discharge status: Home / Self Care | Payer: Self-pay | Source: Ambulatory Visit

## 2024-06-26 ENCOUNTER — Ambulatory Visit
Admission: RE | Admit: 2024-06-26 | Discharge: 2024-06-26 | Disposition: A | Discharge status: Home / Self Care | Source: Ambulatory Visit

## 2024-06-26 ENCOUNTER — Encounter: Payer: Self-pay | Admitting: Emergency Medicine

## 2024-06-26 DIAGNOSIS — H9201 Otalgia, right ear: Secondary | ICD-10-CM

## 2024-06-26 DIAGNOSIS — H608X1 Other otitis externa, right ear: Secondary | ICD-10-CM

## 2024-06-26 MED ORDER — CIPROFLOXACIN-DEXAMETHASONE 0.3-0.1 % OT SUSP
4.0000 [drp] | Freq: Two times a day (BID) | OTIC | 0 refills | Status: AC
Start: 2024-06-26 — End: 2024-07-03

## 2024-06-26 NOTE — ED Provider Notes (Signed)
 [name]    CSN: [id number] Arrival date & time: 06/26/24  1059      History   Chief Complaint Chief Complaint  Patient presents with   Otalgia    HPI Joseph Buchanan is a 23 y.o. male.   Patient presents today for evaluation of discomfort to her right ear that started 5 days ago.  He reports he does have some muffled hearing and notes that when he is moving his auricle and compressing the ear he has pain.  He states he is not sure if he has a pimple in his ear as this happened before with similar presentation.  He denies any fever.  He has not any congestion or issues with his left ear.  The history is provided by the patient.  Otalgia Associated symptoms: no abdominal pain, no congestion, no cough, no fever, no sore throat and no vomiting     Past Medical History:  Diagnosis Date   Hand injury    Broken right hand    Patient Active Problem List   Diagnosis Date Noted   Seborrheic dermatitis 11/25/2021   Lesion of lip 11/25/2021    History reviewed. No pertinent surgical history.     Home Medications    Prior to Admission medications   Medication Sig Start Date End Date Taking? Authorizing Provider  amoxicillin  (AMOXIL ) 500 MG tablet Take 500 mg by mouth every 8 (eight) hours. 05/09/24  Yes [provider]  chlorhexidine (PERIDEX) 0.12 % solution Use as directed 5 mLs in the mouth or throat 2 (two) times daily. 05/09/24  Yes [provider]  ciprofloxacin-dexamethasone (CIPRODEX) OTIC suspension Place 4 drops into the right ear 2 (two) times daily for 7 days. 06/26/24 07/03/24 Yes Billy Asberry FALCON, PA-C  ibuprofen  (ADVIL ) 800 MG tablet Take 800 mg by mouth every 8 (eight) hours as needed. 05/09/24  Yes [provider]  acetaminophen-codeine (TYLENOL #3) 300-30 MG tablet Take 1 tablet by mouth every 6 (six) hours as needed. Patient not taking: Reported on 06/26/2024 05/09/24   [provider]  mupirocin  ointment  (BACTROBAN ) 2 % Apply 1 Application topically 2 (two) times daily. Do not apply inside the mouth. Patient not taking: Reported on 06/26/2024 02/19/23   Hazen Darryle BRAVO, FNP    Family History Family History  Problem Relation Age of Onset   Healthy Mother    Healthy Father     Social History Social History   Tobacco Use   Smoking status: Some Days    Types: Cigars    Passive exposure: Yes   Smokeless tobacco: Never   Tobacco comments:    Smokes black and mild cigars - 2-3 a week  Vaping Use   Vaping status: Never Used  Substance Use Topics   Alcohol use: Yes    Comment: 1-2 a month   Drug use: Yes    Frequency: 7.0 times per week    Types: Marijuana    Comment: daily smoking     Allergies   Patient has no known allergies.   Review of Systems Review of Systems  Constitutional:  Negative for chills and fever.  HENT:  Positive for ear pain. Negative for congestion and sore throat.   Eyes:  Negative for discharge and redness.  Respiratory:  Negative for cough and shortness of breath.   Gastrointestinal:  Negative for abdominal pain, nausea and vomiting.     Physical Exam Triage Vital Signs ED Triage Vitals  Encounter Vitals Group  BP 06/26/24 1113 136/87     Girls Systolic BP Percentile --      Girls Diastolic BP Percentile --      Boys Systolic BP Percentile --      Boys Diastolic BP Percentile --      Pulse Rate 06/26/24 1113 77     Resp 06/26/24 1113 18     Temp 06/26/24 1113 98 F (36.7 C)     Temp Source 06/26/24 1113 Oral     SpO2 06/26/24 1113 97 %     Weight --      Height --      Head Circumference --      Peak Flow --      Pain Score 06/26/24 1116 10     Pain Loc --      Pain Education --      Exclude from Growth Chart --    No data found.  Updated Vital Signs BP 136/87 (BP Location: Left Arm)   Pulse 77   Temp 98 F (36.7 C) (Oral)   Resp 18   SpO2 97%   Visual Acuity Right Eye Distance:   Left Eye Distance:   Bilateral  Distance:    Right Eye Near:   Left Eye Near:    Bilateral Near:     Physical Exam Vitals and nursing note reviewed.  Constitutional:      General: He is not in acute distress.    Appearance: Normal appearance. He is not ill-appearing.  HENT:     Head: Normocephalic and atraumatic.     Left Ear: Tympanic membrane normal.     Ears:     Comments: Right EAC erythematous and inflamed.  Mild cerumen appreciated but unable to visualize right TM due to inflammation.  Right auricle movement and tragal movement tenderness noted.    Nose: Nose normal. No congestion.   Eyes:     Conjunctiva/sclera: Conjunctivae normal.    Cardiovascular:     Rate and Rhythm: Normal rate.  Pulmonary:     Effort: Pulmonary effort is normal. No respiratory distress.   Skin:    General: Skin is warm and dry.   Neurological:     Mental Status: He is alert.   Psychiatric:        Mood and Affect: Mood normal.        Thought Content: Thought content normal.      UC Treatments / Results  Labs (all labs ordered are listed, but only abnormal results are displayed) Labs Reviewed - No data to display  EKG   Radiology No results found.  Procedures Procedures (including critical care time)  Medications Ordered in UC Medications - No data to display  Initial Impression / Assessment and Plan / UC Course  I have reviewed the triage vital signs and the nursing notes.  Pertinent labs & imaging results that were available during my care of the patient were reviewed by me and considered in my medical decision making (see chart for details).    Will treat to cover possible otitis externa with Ciprodex drops.  Possible cerumen impaction as well but difficult to visualize on exam due to tenderness of EAC and inflammation of same.  Advised follow-up if no gradual improvement or with any worsening symptoms.  Final Clinical Impressions(s) / UC Diagnoses   Final diagnoses:  Acute otalgia, right  Other  otitis externa, right ear   Discharge Instructions   None    ED Prescriptions  Medication Sig Dispense Auth. Provider   ciprofloxacin-dexamethasone (CIPRODEX) OTIC suspension Place 4 drops into the right ear 2 (two) times daily for 7 days. 7.5 mL Billy Asberry FALCON, PA-C      PDMP not reviewed this encounter.   Billy Asberry FALCON, PA-C 06/26/24 1212

## 2024-06-26 NOTE — ED Triage Notes (Signed)
 Pt notes R ear discomfort x5 days. Notes muffled hearing and pain with head movement or compressing the ear. Pt believes he may have a pimple in the ear as this has happened in the past.

## 2024-11-07 ENCOUNTER — Ambulatory Visit
Admission: EM | Admit: 2024-11-07 | Discharge: 2024-11-07 | Disposition: A | Discharge status: Home / Self Care | Attending: Student | Admitting: Student

## 2024-11-07 ENCOUNTER — Other Ambulatory Visit: Payer: Self-pay

## 2024-11-07 ENCOUNTER — Encounter: Payer: Self-pay | Admitting: Emergency Medicine

## 2024-11-07 DIAGNOSIS — R399 Unspecified symptoms and signs involving the genitourinary system: Secondary | ICD-10-CM

## 2024-11-07 LAB — GLUCOSE, POCT (MANUAL RESULT ENTRY): POC Glucose: 104 mg/dL — AB (ref 70–99)

## 2024-11-07 LAB — POCT URINE DIPSTICK
Bilirubin, UA: NEGATIVE
Blood, UA: NEGATIVE
Glucose, UA: NEGATIVE mg/dL
Ketones, POC UA: NEGATIVE mg/dL
Leukocytes, UA: NEGATIVE
Nitrite, UA: NEGATIVE
POC PROTEIN,UA: NEGATIVE
Spec Grav, UA: 1.015 (ref 1.010–1.025)
Urobilinogen, UA: 0.2 U/dL
pH, UA: 6 (ref 5.0–8.0)

## 2024-11-07 NOTE — ED Triage Notes (Signed)
 Pt sts urinary frequency x 1 month

## 2024-11-07 NOTE — Discharge Instructions (Addendum)
-  Your urine test looks normal. -Your blood sugar is normal -We are also testing for gonorrhea, chlamydia, trichomonas. -We will call with any positive results in about 3 business days and can send treatment if necessary. -We will not call with negative lab results. -Lab results will automatically go to your MyChart. - Follow-up with primary care provider if symptoms persist.

## 2024-11-07 NOTE — ED Provider Notes (Signed)
 [name]    CSN: [id number] Arrival date & time: 11/07/24  1833      History   Chief Complaint Chief Complaint  Patient presents with   Urinary Frequency    HPI Joseph Buchanan is a 23 y.o. male presenting with urinary frequency, slight irritation, and sensation of incomplete void. H/o noncontributory. Also with brief episode of suprapubic tenderness.  Denies current abdominal pain.  Denies nausea, vomiting, diarrhea.  Denies penile discharge, penile pain, testicular pain, new back pain, fevers.  HPI  Past Medical History:  Diagnosis Date   Hand injury    Broken right hand    Patient Active Problem List   Diagnosis Date Noted   Seborrheic dermatitis 11/25/2021   Lesion of lip 11/25/2021    History reviewed. No pertinent surgical history.     Home Medications    Prior to Admission medications   Medication Sig Start Date End Date Taking? Authorizing Provider  amoxicillin  (AMOXIL ) 500 MG tablet Take 500 mg by mouth every 8 (eight) hours. 05/09/24   [provider]  chlorhexidine (PERIDEX) 0.12 % solution Use as directed 5 mLs in the mouth or throat 2 (two) times daily. 05/09/24   [provider]  ibuprofen  (ADVIL ) 800 MG tablet Take 800 mg by mouth every 8 (eight) hours as needed. 05/09/24   [provider]    Family History Family History  Problem Relation Age of Onset   Healthy Mother    Healthy Father     Social History Social History   Tobacco Use   Smoking status: Some Days    Types: Cigars    Passive exposure: Yes   Smokeless tobacco: Never   Tobacco comments:    Smokes black and mild cigars - 2-3 a week  Vaping Use   Vaping status: Never Used  Substance Use Topics   Alcohol use: Yes    Comment: 1-2 a month   Drug use: Yes    Frequency: 7.0 times per week    Types: Marijuana    Comment: daily smoking     Allergies   Patient has no known allergies.   Review of Systems Review of Systems   Constitutional:  Negative for appetite change, chills, diaphoresis and fever.  Respiratory:  Negative for shortness of breath.   Cardiovascular:  Negative for chest pain.  Gastrointestinal:  Negative for abdominal pain, blood in stool, constipation, diarrhea, nausea and vomiting.  Genitourinary:  Positive for frequency. Negative for decreased urine volume, difficulty urinating, dysuria, flank pain, genital sores, hematuria and urgency.  Musculoskeletal:  Negative for back pain.  Neurological:  Negative for dizziness, weakness and light-headedness.  All other systems reviewed and are negative.    Physical Exam Triage Vital Signs ED Triage Vitals  Encounter Vitals Group     BP 11/07/24 1909 137/83     Girls Systolic BP Percentile --      Girls Diastolic BP Percentile --      Boys Systolic BP Percentile --      Boys Diastolic BP Percentile --      Pulse Rate 11/07/24 1909 72     Resp 11/07/24 1909 18     Temp 11/07/24 1909 99.3 F (37.4 C)     Temp Source 11/07/24 1909 Oral     SpO2 11/07/24 1909 98 %     Weight --      Height --      Head Circumference --      Peak Flow --  Pain Score 11/07/24 1910 0     Pain Loc --      Pain Education --      Exclude from Growth Chart --    No data found.  Updated Vital Signs BP 137/83 (BP Location: Left Arm)   Pulse 72   Temp 99.3 F (37.4 C) (Oral)   Resp 18   SpO2 98%   Visual Acuity Right Eye Distance:   Left Eye Distance:   Bilateral Distance:    Right Eye Near:   Left Eye Near:    Bilateral Near:     Physical Exam Vitals reviewed.  Constitutional:      General: He is not in acute distress.    Appearance: Normal appearance. He is not ill-appearing.  HENT:     Head: Normocephalic and atraumatic.     Mouth/Throat:     Mouth: Mucous membranes are moist.     Comments: Moist mucous membranes Eyes:     Extraocular Movements: Extraocular movements intact.     Pupils: Pupils are equal, round, and reactive to light.   Cardiovascular:     Rate and Rhythm: Normal rate and regular rhythm.     Heart sounds: Normal heart sounds.  Pulmonary:     Effort: Pulmonary effort is normal.     Breath sounds: Normal breath sounds. No wheezing, rhonchi or rales.  Abdominal:     General: Bowel sounds are normal. There is no distension.     Palpations: Abdomen is soft. There is no mass.     Tenderness: There is no abdominal tenderness. There is no right CVA tenderness, left CVA tenderness, guarding or rebound.  Skin:    General: Skin is warm.     Capillary Refill: Capillary refill takes less than 2 seconds.     Comments: Good skin turgor  Neurological:     General: No focal deficit present.     Mental Status: He is alert and oriented to person, place, and time.  Psychiatric:        Mood and Affect: Mood normal.        Behavior: Behavior normal.      UC Treatments / Results  Labs (all labs ordered are listed, but only abnormal results are displayed) Labs Reviewed  URINE CULTURE  POCT URINE DIPSTICK  GLUCOSE, POCT (MANUAL RESULT ENTRY)  CYTOLOGY, (ORAL, ANAL, URETHRAL) ANCILLARY ONLY    EKG   Radiology No results found.  Procedures Procedures (including critical care time)  Medications Ordered in UC Medications - No data to display  Initial Impression / Assessment and Plan / UC Course  I have reviewed the triage vital signs and the nursing notes.  Pertinent labs & imaging results that were available during my care of the patient were reviewed by me and considered in my medical decision making (see chart for details).     Patient is a pleasant 23 year old male presenting with urinary frequency for 1 month. The patient is afebrile and nontachycardic.  Antipyretic has not been administered today.  On exam, there is no abdominal pain or CVAT.  UA wnl, culture sent. Cytology swab sent for gonorrhea, chlamydia, trichomonas.  Safe sex precautions.  Final Clinical Impressions(s) / UC Diagnoses    Final diagnoses:  Urinary symptom or sign     Discharge Instructions      -Your urine test looks normal. -Your blood sugar is normal -We are also testing for gonorrhea, chlamydia, trichomonas. -We will call with any positive results in about 3  business days and can send treatment if necessary. -We will not call with negative lab results. -Lab results will automatically go to your MyChart. - Follow-up with primary care provider if symptoms persist.      ED Prescriptions   None    PDMP not reviewed this encounter.   Arlyss Leita BRAVO, PA-C 11/07/24 1943

## 2024-11-08 ENCOUNTER — Ambulatory Visit: Payer: Self-pay

## 2024-11-08 LAB — CYTOLOGY, (ORAL, ANAL, URETHRAL) ANCILLARY ONLY
Chlamydia: NEGATIVE
Comment: NEGATIVE
Comment: NEGATIVE
Comment: NORMAL
Neisseria Gonorrhea: NEGATIVE
Trichomonas: NEGATIVE

## 2024-11-08 LAB — URINE CULTURE: Culture: NO GROWTH

## 2024-11-09 ENCOUNTER — Ambulatory Visit (HOSPITAL_COMMUNITY): Payer: Self-pay
